# Patient Record
Sex: Female | Born: 1945
Health system: Southern US, Community
[De-identification: ages and names within clinical notes are randomized; demographics above are authoritative.]

## PROBLEM LIST (undated history)

## (undated) DIAGNOSIS — L719 Rosacea, unspecified: Secondary | ICD-10-CM

## (undated) DIAGNOSIS — M1990 Unspecified osteoarthritis, unspecified site: Secondary | ICD-10-CM

## (undated) DIAGNOSIS — I10 Essential (primary) hypertension: Secondary | ICD-10-CM

## (undated) HISTORY — PX: FOOT SURGERY: SHX648

## (undated) HISTORY — PX: HAND SURGERY: SHX662

---

## 2007-06-02 ENCOUNTER — Ambulatory Visit: Payer: Self-pay | Admitting: Unknown Physician Specialty

## 2012-08-07 ENCOUNTER — Ambulatory Visit: Payer: Self-pay | Admitting: Physical Medicine and Rehabilitation

## 2012-10-23 ENCOUNTER — Other Ambulatory Visit: Payer: Self-pay | Admitting: Obstetrics and Gynecology

## 2012-10-23 DIAGNOSIS — R928 Other abnormal and inconclusive findings on diagnostic imaging of breast: Secondary | ICD-10-CM

## 2012-10-29 ENCOUNTER — Ambulatory Visit
Admission: RE | Admit: 2012-10-29 | Discharge: 2012-10-29 | Disposition: A | Discharge status: Home / Self Care | Payer: BC Managed Care – PPO | Source: Ambulatory Visit | Attending: Obstetrics and Gynecology | Admitting: Obstetrics and Gynecology

## 2012-10-29 DIAGNOSIS — R928 Other abnormal and inconclusive findings on diagnostic imaging of breast: Secondary | ICD-10-CM

## 2016-02-08 ENCOUNTER — Emergency Department
Admission: EM | Admit: 2016-02-08 | Discharge: 2016-02-08 | Disposition: A | Discharge status: Home / Self Care | Payer: Medicare Other | Attending: Emergency Medicine | Admitting: Emergency Medicine

## 2016-02-08 DIAGNOSIS — X58XXXA Exposure to other specified factors, initial encounter: Secondary | ICD-10-CM | POA: Diagnosis not present

## 2016-02-08 DIAGNOSIS — L500 Allergic urticaria: Secondary | ICD-10-CM | POA: Insufficient documentation

## 2016-02-08 DIAGNOSIS — Y9389 Activity, other specified: Secondary | ICD-10-CM | POA: Diagnosis not present

## 2016-02-08 DIAGNOSIS — Z79899 Other long term (current) drug therapy: Secondary | ICD-10-CM | POA: Insufficient documentation

## 2016-02-08 DIAGNOSIS — Y998 Other external cause status: Secondary | ICD-10-CM | POA: Diagnosis not present

## 2016-02-08 DIAGNOSIS — R0602 Shortness of breath: Secondary | ICD-10-CM | POA: Insufficient documentation

## 2016-02-08 DIAGNOSIS — T7840XA Allergy, unspecified, initial encounter: Secondary | ICD-10-CM | POA: Diagnosis not present

## 2016-02-08 DIAGNOSIS — Y9289 Other specified places as the place of occurrence of the external cause: Secondary | ICD-10-CM | POA: Diagnosis not present

## 2016-02-08 DIAGNOSIS — F419 Anxiety disorder, unspecified: Secondary | ICD-10-CM | POA: Insufficient documentation

## 2016-02-08 MED ORDER — METHYLPREDNISOLONE SODIUM SUCC 125 MG IJ SOLR
125.0000 mg | Freq: Once | INTRAMUSCULAR | Status: AC
  Administered 2016-02-08: 125 mg via INTRAVENOUS

## 2016-02-08 MED ORDER — DIPHENHYDRAMINE HCL 50 MG/ML IJ SOLN
50.0000 mg | Freq: Once | INTRAMUSCULAR | Status: AC
  Administered 2016-02-08: 50 mg via INTRAVENOUS

## 2016-02-08 MED ORDER — DIPHENHYDRAMINE HCL 25 MG PO CAPS: 50.0000 mg | ORAL_CAPSULE | Freq: Three times a day (TID) | ORAL | Status: DC

## 2016-02-08 MED ORDER — FAMOTIDINE IN NACL 20-0.9 MG/50ML-% IV SOLN
20.0000 mg | Freq: Once | INTRAVENOUS | Status: AC
  Administered 2016-02-08: 20 mg via INTRAVENOUS

## 2016-02-08 MED ORDER — SODIUM CHLORIDE 0.9 % IV BOLUS (SEPSIS)
1000.0000 mL | Freq: Once | INTRAVENOUS | Status: AC
  Administered 2016-02-08: 1000 mL via INTRAVENOUS

## 2016-02-08 MED ORDER — PREDNISONE 20 MG PO TABS: 40.0000 mg | ORAL_TABLET | Freq: Every day | ORAL | Status: AC

## 2016-02-08 NOTE — Discharge Instructions (Signed)
Allergies °An allergy is an abnormal reaction to a substance by the body's defense system (immune system). Allergies can develop at any age. °WHAT CAUSES ALLERGIES? °An allergic reaction happens when the immune system mistakenly reacts to a normally harmless substance, called an allergen, as if it were harmful. The immune system releases antibodies to fight the substance. Antibodies eventually release a chemical called histamine into the bloodstream. The release of histamine is meant to protect the body from infection, but it also causes discomfort. °An allergic reaction can be triggered by: °· Eating an allergen. °· Inhaling an allergen. °· Touching an allergen. °WHAT TYPES OF ALLERGIES ARE THERE? °There are many types of allergies. Common types include: °· Seasonal allergies. People with this type of allergy are usually allergic to substances that are only present during certain seasons, such as molds and pollens. °· Food allergies. °· Drug allergies. °· Insect allergies. °· Animal dander allergies. °WHAT ARE SYMPTOMS OF ALLERGIES? °Possible allergy symptoms include: °· Swelling of the lips, face, tongue, mouth, or throat. °· Sneezing, coughing, or wheezing. °· Nasal congestion. °· Tingling in the mouth. °· Rash. °· Itching. °· Itchy, red, swollen areas of skin (hives). °· Watery eyes. °· Vomiting. °· Diarrhea. °· Dizziness. °· Lightheadedness. °· Fainting. °· Trouble breathing or swallowing. °· Chest tightness. °· Rapid heartbeat. °HOW ARE ALLERGIES DIAGNOSED? °Allergies are diagnosed with a medical and family history and one or more of the following: °· Skin tests. °· Blood tests. °· A food diary. A food diary is a record of all the foods and drinks you have in a day and of all the symptoms you experience. °· The results of an elimination diet. An elimination diet involves eliminating foods from your diet and then adding them back in one by one to find out if a certain food causes an allergic reaction. °HOW ARE  ALLERGIES TREATED? °There is no cure for allergies, but allergic reactions can be treated with medicine. Severe reactions usually need to be treated at a hospital. °HOW CAN REACTIONS BE PREVENTED? °The best way to prevent an allergic reaction is by avoiding the substance you are allergic to. Allergy shots and medicines can also help prevent reactions in some cases. People with severe allergic reactions may be able to prevent a life-threatening reaction called anaphylaxis with a medicine given right after exposure to the allergen. °  °This information is not intended to replace advice given to you by your health care provider. Make sure you discuss any questions you have with your health care provider. °  °Document Released: 02/12/2003 Document Revised: 12/10/2014 Document Reviewed: 08/31/2014 °Elsevier Interactive Patient Education ©2016 [employer] ° ° °Please return immediately if condition worsens. Please contact her primary physician or the physician you were given for referral. If you have any specialist physicians involved in her treatment and plan please also contact them. Thank you for using [facility]. ° °

## 2016-02-08 NOTE — ED Provider Notes (Signed)
Time Seen: Approximately 1500  I have reviewed the triage notes  Chief Complaint: Allergic Reaction   History of Present Illness: Tara Reynolds is a 70 y.o. female who has a history of multiple environmental allergies, etc. Patient's not aware of any new exposures. She arrives to the emergency department respiratory distress with hives. Patient states that she started developing some feelings of not feeling well and some diffuse itching which started yesterday. She took some liquid Benadryl and applied some Benadryl cream to different areas where what she describes as hives seemed to pop off on her chest back extremities etc. Patient states that the itching continued throughout last night and this morning. The patient states that she suddenly became very short of breath and just prior to evaluation here in emergency department received an EpiPen. She has no history of cardiovascular disease.   No past medical history on file.  There are no active problems to display for this patient.   No past surgical history on file.  No past surgical history on file.  Current Outpatient Rx  Name  Route  Sig  Dispense  Refill  . hydrochlorothiazide (HYDRODIURIL) 25 MG tablet   Oral   Take 12.5 mg by mouth daily.         . simvastatin (ZOCOR) 10 MG tablet   Oral   Take 10 mg by mouth at bedtime.         Marland Kitchen. venlafaxine XR (EFFEXOR-XR) 150 MG 24 hr capsule   Oral   Take 150 mg by mouth daily with breakfast.           Allergies:  Shellfish allergy  Family History: No family history on file.  Social History: Social History  Substance Use Topics  . Smoking status: Not on file  . Smokeless tobacco: Not on file  . Alcohol Use: Not on file     Review of Systems:   10 point review of systems was performed and was otherwise negative:  Constitutional: No fever Eyes: No visual disturbances ENT: No sore throat, ear pain Cardiac: No chest pain Respiratory: Shortness of  breath Abdomen: No abdominal pain, no vomiting, No diarrhea Endocrine: No weight loss, No night sweats Extremities: No peripheral edema, cyanosis Skin: Diffuse pruritic erythematous rash Neurologic: No focal weakness, trouble with speech or swollowing Urologic: No dysuria, Hematuria, or urinary frequency   Physical Exam:  ED Triage Vitals  Enc Vitals Group     BP 02/08/16 1450 130/98 mmHg     Pulse Rate 02/08/16 1450 119     Resp 02/08/16 1506 18     Temp --      Temp src --      SpO2 02/08/16 1450 100 %     Weight 02/08/16 1506 175 lb (79.379 kg)     Height 02/08/16 1506 5\' 4"  (1.626 m)     Head Cir --      Peak Flow --      Pain Score --      Pain Loc --      Pain Edu? --      Excl. in GC? --     General: Awake , Alert , and Oriented times 3; anxious sitting upright in a stretcher tachypnea, able to speak Head: Normal cephalic , atraumatic Eyes: Pupils equal , round, reactive to light Nose/Throat: No nasal drainage, patent upper airway no obvious soft tissue swelling in the oral airway Neck: Supple, Full range of motion, no stridor Lungs: Limited air  movement at the apices without wheezing or rhonchi noted Heart: Regular rate, regular rhythm without murmurs , gallops , or rubs Abdomen: Soft, non tender without rebound, guarding , or rigidity; bowel sounds positive and symmetric in all 4 quadrants. No organomegaly .        Extremities: 2 plus symmetric pulses. No edema, clubbing or cyanosis Neurologic: normal ambulation, Motor symmetric without deficits, sensory intact Skin: Diffuse hives like rash across the extremities chest, back, abdomen etc.   EKG:   ED ECG REPORT I, Jennye Moccasin, the attending physician, personally viewed and interpreted this ECG.  Date: 02/08/2016 EKG Time: 1458 Rate: *95 Rhythm: normal sinus rhythm QRS Axis: normal Intervals: normal ST/T Wave abnormalities: normal Conduction Disturbances: none Narrative Interpretation:  unremarkable Low-voltage QRSs Nonspecific ST changes No acute ischemic change   ED Course:  Patient's stay here was uneventful and the patient was started on IV Pepcid, IV Solu-Medrol, and 50 mg of IV Benadryl. Patient was observed for 2 hours and essentially had clearing of all rash. She states her shortness of breath went away shortly after the evaluation at the bedside. Her main hemodynamically stable and states she lives near the hospital. She does have 1 more EpiPen at home and advised him to follow up with her primary physician or allergist for a recheck. We are going to prescribe prednisone to start tomorrow and the patient should receive 50 mg of Benadryl every 8 hours around-the-clock for 72 hours    Assessment: Allergic reaction      Plan:  Outpatient management Patient was advised to return immediately if condition worsens. Patient was advised to follow up with their primary care physician or other specialized physicians involved in their outpatient care            Jennye Moccasin, MD 02/08/16 1734

## 2016-02-10 DIAGNOSIS — M4726 Other spondylosis with radiculopathy, lumbar region: Secondary | ICD-10-CM | POA: Insufficient documentation

## 2016-02-10 DIAGNOSIS — M5416 Radiculopathy, lumbar region: Secondary | ICD-10-CM | POA: Insufficient documentation

## 2016-02-10 DIAGNOSIS — M5136 Other intervertebral disc degeneration, lumbar region: Secondary | ICD-10-CM | POA: Insufficient documentation

## 2016-03-14 DIAGNOSIS — I10 Essential (primary) hypertension: Secondary | ICD-10-CM | POA: Insufficient documentation

## 2016-04-16 ENCOUNTER — Other Ambulatory Visit: Payer: Self-pay | Admitting: Physical Medicine and Rehabilitation

## 2016-04-16 DIAGNOSIS — M5416 Radiculopathy, lumbar region: Secondary | ICD-10-CM

## 2016-05-07 ENCOUNTER — Ambulatory Visit
Admission: RE | Admit: 2016-05-07 | Discharge: 2016-05-07 | Disposition: A | Discharge status: Home / Self Care | Payer: Medicare Other | Source: Ambulatory Visit | Attending: Physical Medicine and Rehabilitation | Admitting: Physical Medicine and Rehabilitation

## 2016-05-07 DIAGNOSIS — M5416 Radiculopathy, lumbar region: Secondary | ICD-10-CM | POA: Diagnosis not present

## 2016-05-07 DIAGNOSIS — M47816 Spondylosis without myelopathy or radiculopathy, lumbar region: Secondary | ICD-10-CM | POA: Insufficient documentation

## 2016-05-07 DIAGNOSIS — M4806 Spinal stenosis, lumbar region: Secondary | ICD-10-CM | POA: Insufficient documentation

## 2016-05-07 DIAGNOSIS — M2578 Osteophyte, vertebrae: Secondary | ICD-10-CM | POA: Diagnosis not present

## 2016-05-07 DIAGNOSIS — M5137 Other intervertebral disc degeneration, lumbosacral region: Secondary | ICD-10-CM | POA: Diagnosis not present

## 2016-08-10 ENCOUNTER — Other Ambulatory Visit
Admission: RE | Admit: 2016-08-10 | Discharge: 2016-08-10 | Disposition: A | Discharge status: Home / Self Care | Payer: Medicare Other | Source: Ambulatory Visit | Attending: Ophthalmology | Admitting: Ophthalmology

## 2016-08-10 DIAGNOSIS — H00033 Abscess of eyelid right eye, unspecified eyelid: Secondary | ICD-10-CM | POA: Diagnosis present

## 2016-08-10 DIAGNOSIS — H0012 Chalazion right lower eyelid: Secondary | ICD-10-CM | POA: Insufficient documentation

## 2016-08-13 LAB — EYE CULTURE

## 2017-11-20 DIAGNOSIS — R7303 Prediabetes: Secondary | ICD-10-CM | POA: Insufficient documentation

## 2017-11-20 DIAGNOSIS — Z66 Do not resuscitate: Secondary | ICD-10-CM | POA: Insufficient documentation

## 2019-12-16 DIAGNOSIS — Z6830 Body mass index (BMI) 30.0-30.9, adult: Secondary | ICD-10-CM | POA: Diagnosis not present

## 2019-12-16 DIAGNOSIS — Z01419 Encounter for gynecological examination (general) (routine) without abnormal findings: Secondary | ICD-10-CM | POA: Diagnosis not present

## 2019-12-16 DIAGNOSIS — Z1231 Encounter for screening mammogram for malignant neoplasm of breast: Secondary | ICD-10-CM | POA: Diagnosis not present

## 2019-12-30 DIAGNOSIS — H2513 Age-related nuclear cataract, bilateral: Secondary | ICD-10-CM | POA: Diagnosis not present

## 2020-05-03 DIAGNOSIS — L710 Perioral dermatitis: Secondary | ICD-10-CM | POA: Diagnosis not present

## 2020-05-12 DIAGNOSIS — R7303 Prediabetes: Secondary | ICD-10-CM | POA: Diagnosis not present

## 2020-05-12 DIAGNOSIS — E7800 Pure hypercholesterolemia, unspecified: Secondary | ICD-10-CM | POA: Diagnosis not present

## 2020-05-19 DIAGNOSIS — F3340 Major depressive disorder, recurrent, in remission, unspecified: Secondary | ICD-10-CM | POA: Diagnosis not present

## 2020-05-19 DIAGNOSIS — I10 Essential (primary) hypertension: Secondary | ICD-10-CM | POA: Diagnosis not present

## 2020-05-19 DIAGNOSIS — Z79899 Other long term (current) drug therapy: Secondary | ICD-10-CM | POA: Diagnosis not present

## 2020-05-19 DIAGNOSIS — Z0000 Encounter for general adult medical examination without abnormal findings: Secondary | ICD-10-CM | POA: Diagnosis not present

## 2020-05-19 DIAGNOSIS — E7800 Pure hypercholesterolemia, unspecified: Secondary | ICD-10-CM | POA: Diagnosis not present

## 2020-05-19 DIAGNOSIS — R7303 Prediabetes: Secondary | ICD-10-CM | POA: Diagnosis not present

## 2020-06-21 DIAGNOSIS — K219 Gastro-esophageal reflux disease without esophagitis: Secondary | ICD-10-CM | POA: Diagnosis not present

## 2020-07-04 DIAGNOSIS — Z1211 Encounter for screening for malignant neoplasm of colon: Secondary | ICD-10-CM | POA: Diagnosis not present

## 2020-07-25 ENCOUNTER — Other Ambulatory Visit: Payer: Self-pay | Admitting: Physician Assistant

## 2020-07-25 DIAGNOSIS — J301 Allergic rhinitis due to pollen: Secondary | ICD-10-CM | POA: Diagnosis not present

## 2020-07-25 DIAGNOSIS — R1310 Dysphagia, unspecified: Secondary | ICD-10-CM

## 2020-07-25 DIAGNOSIS — K219 Gastro-esophageal reflux disease without esophagitis: Secondary | ICD-10-CM | POA: Diagnosis not present

## 2020-07-25 DIAGNOSIS — R490 Dysphonia: Secondary | ICD-10-CM | POA: Diagnosis not present

## 2020-07-26 DIAGNOSIS — M1712 Unilateral primary osteoarthritis, left knee: Secondary | ICD-10-CM | POA: Diagnosis not present

## 2020-07-29 ENCOUNTER — Other Ambulatory Visit: Payer: Self-pay

## 2020-07-29 ENCOUNTER — Ambulatory Visit
Admission: RE | Admit: 2020-07-29 | Discharge: 2020-07-29 | Disposition: A | Discharge status: Home / Self Care | Payer: Medicare HMO | Source: Ambulatory Visit | Attending: Physician Assistant | Admitting: Physician Assistant

## 2020-07-29 DIAGNOSIS — K449 Diaphragmatic hernia without obstruction or gangrene: Secondary | ICD-10-CM | POA: Diagnosis not present

## 2020-07-29 DIAGNOSIS — R1310 Dysphagia, unspecified: Secondary | ICD-10-CM | POA: Diagnosis not present

## 2020-08-24 DIAGNOSIS — K219 Gastro-esophageal reflux disease without esophagitis: Secondary | ICD-10-CM | POA: Diagnosis not present

## 2020-08-24 DIAGNOSIS — R1310 Dysphagia, unspecified: Secondary | ICD-10-CM | POA: Diagnosis not present

## 2020-08-24 DIAGNOSIS — R490 Dysphonia: Secondary | ICD-10-CM | POA: Diagnosis not present

## 2020-09-19 DIAGNOSIS — L281 Prurigo nodularis: Secondary | ICD-10-CM | POA: Diagnosis not present

## 2020-09-19 DIAGNOSIS — L578 Other skin changes due to chronic exposure to nonionizing radiation: Secondary | ICD-10-CM | POA: Diagnosis not present

## 2020-09-19 DIAGNOSIS — L218 Other seborrheic dermatitis: Secondary | ICD-10-CM | POA: Diagnosis not present

## 2020-09-19 DIAGNOSIS — L821 Other seborrheic keratosis: Secondary | ICD-10-CM | POA: Diagnosis not present

## 2020-09-19 DIAGNOSIS — L718 Other rosacea: Secondary | ICD-10-CM | POA: Diagnosis not present

## 2020-10-13 ENCOUNTER — Ambulatory Visit: Payer: Medicare Other | Admitting: Gastroenterology

## 2020-11-01 ENCOUNTER — Other Ambulatory Visit: Payer: Self-pay

## 2020-11-01 DIAGNOSIS — E7800 Pure hypercholesterolemia, unspecified: Secondary | ICD-10-CM | POA: Insufficient documentation

## 2020-11-01 DIAGNOSIS — F3340 Major depressive disorder, recurrent, in remission, unspecified: Secondary | ICD-10-CM | POA: Insufficient documentation

## 2020-11-01 DIAGNOSIS — M8580 Other specified disorders of bone density and structure, unspecified site: Secondary | ICD-10-CM | POA: Insufficient documentation

## 2020-11-02 ENCOUNTER — Ambulatory Visit (INDEPENDENT_AMBULATORY_CARE_PROVIDER_SITE_OTHER): Payer: Medicare HMO | Admitting: Gastroenterology

## 2020-11-02 ENCOUNTER — Encounter: Payer: Self-pay | Admitting: Gastroenterology

## 2020-11-02 ENCOUNTER — Other Ambulatory Visit: Payer: Self-pay

## 2020-11-02 VITALS — BP 117/77 | HR 76 | Temp 98.1°F | Ht 64.0 in | Wt 162.8 lb

## 2020-11-02 DIAGNOSIS — K219 Gastro-esophageal reflux disease without esophagitis: Secondary | ICD-10-CM

## 2020-11-02 DIAGNOSIS — R1310 Dysphagia, unspecified: Secondary | ICD-10-CM

## 2020-11-03 NOTE — Progress Notes (Signed)
[facility] [address]  [address]  [place], [place] [address]  Main: [phone]  Fax: [phone]   Gastroenterology Consultation  Referring Provider:     Wendy Reynolds* Primary Care Physician:  Tara Ivan, MD Reason for Consultation:             HPI:    Chief Complaint  Patient presents with  . Hiatal Hernia    Patient has some tightness on her upper chest. Currently on Dexilant daily.    Tara Reynolds is a 74 y.o. y/o female referred for consultation & management  by Dr. Marisue Ivan, MD.  Patient was recently seen by ENT due to voice changes and was noted to have findings of laryngeal pharyngeal reflux on laryngoscopy.  She has been started on omeprazole.  She continues to report globus sensation.  She then underwent esophagram that showed mild B ring, small hiatal hernia and patient was referred to [place].  Patient denies any previous upper endoscopy.  Does have intermittent odynophagia as well.  No weight loss.  No nausea or vomiting  She was [facility] and underwent CRC screening with Cologuard testing negative in August 2021.  I personally reviewed their office note and in July 2018 note they state that patient had a colonoscopy in 2008 that showed diverticulosis, internal hemorrhoids.  History reviewed. No pertinent past medical history.  History reviewed. No pertinent surgical history.  Prior to Admission medications   Medication Sig Start Date End Date Taking? Authorizing Provider  Calcium Carbonate-Vitamin D 600-400 MG-UNIT tablet Take 2 tablets by mouth daily.   Yes [provider]  estradiol (ESTRACE) 0.1 MG/GM vaginal cream Place 1 g vaginally as needed. 09/29/20  Yes [provider]  fluocinonide (LIDEX) 0.05 % external solution  09/19/20  Yes [provider]  hydrochlorothiazide (HYDRODIURIL) 25 MG tablet Take 12.5 mg by mouth daily.   Yes [provider]    loratadine (CLARITIN) 10 MG tablet Take 1 tablet by mouth as needed.   Yes [provider]  metroNIDAZOLE (METROCREAM) 0.75 % cream Apply 1 application topically daily. Apply around nose, mouth and eyelids. 05/03/20 05/03/21 Yes [provider]  naproxen sodium (ALEVE) 220 MG tablet Take 1 tablet by mouth daily.   Yes [provider]  omeprazole (PRILOSEC) 20 MG capsule Take 1 capsule by mouth daily. 06/29/20  Yes [provider]  simvastatin (ZOCOR) 20 MG tablet Take 1 tablet by mouth daily. 09/18/20  Yes [provider]  venlafaxine XR (EFFEXOR-XR) 150 MG 24 hr capsule Take 150 mg by mouth daily with breakfast.   Yes [provider]    History reviewed. No pertinent family history.   Social History   Tobacco Use  . Smoking status: Never Smoker  . Smokeless tobacco: Never Used  Substance Use Topics  . Alcohol use: Not Currently  . Drug use: Not on file    Allergies as of 11/02/2020 - Review Complete 11/02/2020  Allergen Reaction Noted  . Citrus bioflavonoid Anaphylaxis, Hives, Rash, and Shortness Of Breath 11/01/2020  . Melatonin Anaphylaxis and Rash 03/14/2016  . Other Anaphylaxis 12/06/2014  . Shellfish allergy Anaphylaxis 02/08/2016    Review of Systems:    All systems reviewed and negative except where noted in HPI.   Physical Exam:  BP 117/77   Pulse 76   Temp 98.1 F (36.7 C) (Oral)   Ht 5\' 4"  (1.626 m)   Wt 162 lb 12.8  oz (73.8 kg)   BMI 27.94 kg/m  No LMP recorded. Patient is postmenopausal. Psych:  Alert and cooperative. Normal mood and affect. General:   Alert,  Well-developed, well-nourished, pleasant and cooperative in NAD Head:  Normocephalic and atraumatic. Eyes:  Sclera clear, no icterus.   Conjunctiva pink. Ears:  Normal auditory acuity. Nose:  No deformity, discharge, or lesions. Mouth:  No deformity or lesions,oropharynx pink & moist. Neck:  Supple; no masses or thyromegaly. Abdomen:  Normal bowel  sounds.  No bruits.  Soft, non-tender and non-distended without masses, hepatosplenomegaly or hernias noted.  No guarding or rebound tenderness.    Msk:  Symmetrical without gross deformities. Good, equal movement & strength bilaterally. Pulses:  Normal pulses noted. Extremities:  No clubbing or edema.  No cyanosis. Neurologic:  Alert and oriented x3;  grossly normal neurologically. Skin:  Intact without significant lesions or rashes. No jaundice. Lymph Nodes:  No significant cervical adenopathy. Psych:  Alert and cooperative. Normal mood and affect.   Labs: CBC No results found for: WBC, RBC, HGB, HCT, PLT, MCV, MCH, MCHC, RDW, LYMPHSABS, MONOABS, EOSABS, BASOSABS CMP  No results found for: NA, K, CL, CO2, GLUCOSE, BUN, CREATININE, CALCIUM, PROT, ALBUMIN, AST, ALT, ALKPHOS, BILITOT, GFRNONAA, GFRAA  Imaging Studies: No results found.  Assessment and Plan:   Tara Reynolds is a 74 y.o. y/o female has been referred for GERD  Patient reports continued breakthrough symptoms despite PPI and is also reporting some odynophagia  EGD indicated for evaluation of the above  Patient educated extensively on acid reflux lifestyle modification, including buying a bed wedge, not eating 3 hrs before bedtime, diet modifications, and handout given for the same.   (Risks of PPI use were discussed with patient including bone loss, C. Diff diarrhea, pneumonia, infections, CKD, electrolyte abnormalities.  Pt. Verbalizes understanding and chooses to continue the medication.)  I have discussed alternative options, risks & benefits,  which include, but are not limited to, bleeding, infection, perforation,respiratory complication & drug reaction.  The patient agrees with this plan & written consent will be obtained.       Dr Tara Reynolds  Speech recognition software was used to dictate the above note.

## 2020-11-11 DIAGNOSIS — I10 Essential (primary) hypertension: Secondary | ICD-10-CM | POA: Diagnosis not present

## 2020-11-11 DIAGNOSIS — E7800 Pure hypercholesterolemia, unspecified: Secondary | ICD-10-CM | POA: Diagnosis not present

## 2020-11-11 DIAGNOSIS — R7303 Prediabetes: Secondary | ICD-10-CM | POA: Diagnosis not present

## 2020-11-18 DIAGNOSIS — Z0000 Encounter for general adult medical examination without abnormal findings: Secondary | ICD-10-CM | POA: Diagnosis not present

## 2020-11-18 DIAGNOSIS — E785 Hyperlipidemia, unspecified: Secondary | ICD-10-CM | POA: Diagnosis not present

## 2020-11-18 DIAGNOSIS — I10 Essential (primary) hypertension: Secondary | ICD-10-CM | POA: Diagnosis not present

## 2020-12-07 ENCOUNTER — Telehealth: Payer: Self-pay

## 2020-12-07 NOTE — Telephone Encounter (Signed)
Patient's call has been returned.  She LVM states that she would like to cancel her EGD scheduled for 12/14/20.  She states she doesn't think she needs to have it done.  Thanks,  [place], [place]

## 2020-12-12 ENCOUNTER — Other Ambulatory Visit: Admission: RE | Admit: 2020-12-12 | Payer: Medicare Other | Source: Ambulatory Visit

## 2020-12-14 ENCOUNTER — Ambulatory Visit: Admit: 2020-12-14 | Payer: Medicare HMO | Admitting: Gastroenterology

## 2020-12-14 SURGERY — ESOPHAGOGASTRODUODENOSCOPY (EGD) WITH PROPOFOL
Anesthesia: General

## 2021-01-18 ENCOUNTER — Other Ambulatory Visit: Payer: Self-pay

## 2021-01-18 ENCOUNTER — Emergency Department: Payer: Medicare Other

## 2021-01-18 ENCOUNTER — Observation Stay
Admission: EM | Admit: 2021-01-18 | Discharge: 2021-01-18 | Disposition: A | Discharge status: Home / Self Care | Payer: Medicare Other | Attending: Internal Medicine | Admitting: Internal Medicine

## 2021-01-18 DIAGNOSIS — N19 Unspecified kidney failure: Secondary | ICD-10-CM

## 2021-01-18 DIAGNOSIS — Z79899 Other long term (current) drug therapy: Secondary | ICD-10-CM | POA: Insufficient documentation

## 2021-01-18 DIAGNOSIS — E860 Dehydration: Secondary | ICD-10-CM | POA: Diagnosis not present

## 2021-01-18 DIAGNOSIS — I10 Essential (primary) hypertension: Secondary | ICD-10-CM | POA: Diagnosis not present

## 2021-01-18 DIAGNOSIS — Z20822 Contact with and (suspected) exposure to covid-19: Secondary | ICD-10-CM | POA: Diagnosis not present

## 2021-01-18 DIAGNOSIS — E7800 Pure hypercholesterolemia, unspecified: Secondary | ICD-10-CM

## 2021-01-18 DIAGNOSIS — Z7982 Long term (current) use of aspirin: Secondary | ICD-10-CM | POA: Insufficient documentation

## 2021-01-18 DIAGNOSIS — R55 Syncope and collapse: Principal | ICD-10-CM | POA: Insufficient documentation

## 2021-01-18 DIAGNOSIS — E876 Hypokalemia: Secondary | ICD-10-CM

## 2021-01-18 HISTORY — DX: Essential (primary) hypertension: I10

## 2021-01-18 LAB — URINALYSIS, COMPLETE (UACMP) WITH MICROSCOPIC
Bilirubin Urine: NEGATIVE
Glucose, UA: NEGATIVE mg/dL
Hgb urine dipstick: NEGATIVE
Ketones, ur: NEGATIVE mg/dL
Leukocytes,Ua: NEGATIVE
Nitrite: NEGATIVE
Protein, ur: NEGATIVE mg/dL
Specific Gravity, Urine: 1.013 (ref 1.005–1.030)
pH: 6 (ref 5.0–8.0)

## 2021-01-18 LAB — COMPREHENSIVE METABOLIC PANEL
ALT: 14 U/L (ref 0–44)
AST: 19 U/L (ref 15–41)
Albumin: 4 g/dL (ref 3.5–5.0)
Alkaline Phosphatase: 51 U/L (ref 38–126)
Anion gap: 10 (ref 5–15)
BUN: 20 mg/dL (ref 8–23)
CO2: 27 mmol/L (ref 22–32)
Calcium: 10 mg/dL (ref 8.9–10.3)
Chloride: 99 mmol/L (ref 98–111)
Creatinine, Ser: 0.94 mg/dL (ref 0.44–1.00)
GFR, Estimated: >60 mL/min (ref >60)
Glucose, Bld: 111 mg/dL — ABNORMAL HIGH (ref 70–99)
Potassium: 3.4 mmol/L — ABNORMAL LOW (ref 3.5–5.1)
Sodium: 136 mmol/L (ref 135–145)
Total Bilirubin: 0.5 mg/dL (ref 0.3–1.2)
Total Protein: 6.7 g/dL (ref 6.5–8.1)

## 2021-01-18 LAB — CBC WITH DIFFERENTIAL/PLATELET
Abs Immature Granulocytes: 0.03 10*3/uL (ref 0.00–0.07)
Basophils Absolute: 0.1 10*3/uL (ref 0.0–0.1)
Basophils Relative: 1 %
Eosinophils Absolute: 0.2 10*3/uL (ref 0.0–0.5)
Eosinophils Relative: 2 %
HCT: 39.9 % (ref 36.0–46.0)
Hemoglobin: 13.7 g/dL (ref 12.0–15.0)
Immature Granulocytes: 0 %
Lymphocytes Relative: 27 %
Lymphs Abs: 2.4 10*3/uL (ref 0.7–4.0)
MCH: 31.8 pg (ref 26.0–34.0)
MCHC: 34.3 g/dL (ref 30.0–36.0)
MCV: 92.6 fL (ref 80.0–100.0)
Monocytes Absolute: 0.9 10*3/uL (ref 0.1–1.0)
Monocytes Relative: 9 %
Neutro Abs: 5.6 10*3/uL (ref 1.7–7.7)
Neutrophils Relative %: 61 %
Platelets: 270 10*3/uL (ref 150–400)
RBC: 4.31 MIL/uL (ref 3.87–5.11)
RDW: 11.8 % (ref 11.5–15.5)
WBC: 9.2 10*3/uL (ref 4.0–10.5)
nRBC: 0 % (ref 0.0–0.2)

## 2021-01-18 LAB — RESP PANEL BY RT-PCR (FLU A&B, COVID) ARPGX2
Influenza A by PCR: NEGATIVE
Influenza B by PCR: NEGATIVE
SARS Coronavirus 2 by RT PCR: NEGATIVE

## 2021-01-18 LAB — TROPONIN I (HIGH SENSITIVITY)
Troponin I (High Sensitivity): 3 ng/L (ref <18)
Troponin I (High Sensitivity): 4 ng/L (ref <18)

## 2021-01-18 LAB — MAGNESIUM: Magnesium: 1.9 mg/dL (ref 1.7–2.4)

## 2021-01-18 LAB — BRAIN NATRIURETIC PEPTIDE: B Natriuretic Peptide: 15.8 pg/mL (ref 0.0–100.0)

## 2021-01-18 MED ORDER — POTASSIUM CHLORIDE CRYS ER 20 MEQ PO TBCR: 40.0000 meq | EXTENDED_RELEASE_TABLET | Freq: Once | ORAL | Status: DC

## 2021-01-18 MED ORDER — VENLAFAXINE HCL ER 150 MG PO CP24
150.0000 mg | ORAL_CAPSULE | Freq: Every day | ORAL | Status: DC
  Filled 2021-01-18: qty 1

## 2021-01-18 MED ORDER — LACTATED RINGERS IV SOLN: INTRAVENOUS | Status: DC

## 2021-01-18 MED ORDER — SIMVASTATIN 10 MG PO TABS: 20.0000 mg | ORAL_TABLET | Freq: Every day | ORAL | Status: DC

## 2021-01-18 MED ORDER — PANTOPRAZOLE SODIUM 40 MG PO TBEC: 40.0000 mg | DELAYED_RELEASE_TABLET | Freq: Every day | ORAL | Status: DC

## 2021-01-18 NOTE — ED Notes (Signed)
Pt states she was at the hair salon and then had LOC. Pt states she does not recall what all happened. Pt resting in bed, husband at bedside. Cardiac, bp and pulse ox monitor on.

## 2021-01-18 NOTE — ED Triage Notes (Addendum)
Pt here via [facility] from hair salon.  Ems reports patient fell asleep in chair, when she woke up her speech was "thick" but not slurred, and her arms were slumped bilaterally. Pt alert and oriented, negative stroke screen with EMS. Pt reports she didn't eat any thing prior to appointment, cbg 118 with EMS.  Pt had L foot surgery feb 4th, denies use of pain medication for two days. Reports using ibuprofen yesterday.    LKW 10am.  EMS VSS- bp 138/81, HR NSR 77.

## 2021-01-18 NOTE — ED Provider Notes (Signed)
[facility] Emergency Department Provider Note   ____________________________________________   Event Date/Time   First MD Initiated Contact with Patient 01/18/21 1157     (approximate)  I have reviewed the triage vital signs and the nursing notes.   HISTORY  Chief Complaint Dizziness    HPI Tara Reynolds is a 75 y.o. female with a stated past medical history of hypertension who presents for an episode of syncope that occurred at approximately 10 AM today.  Patient states that she was at her hairdresser's when the next thing she knew she woke up with EMS around her.  Patient states that she did not have any preceding symptoms including denying any feelings that she was flushed, diaphoresis, chest pain, shortness of breath, headache, blurred vision, or any other preceding symptoms.  Patient denies any pain when she woke up and states that she immediately knew where she was and returned to baseline.  Patient did say that she had an episode of "speaking like I just woke up" and felt as though her arms were heavy.  Patient currently denies any complaints.  Patient denies any previous symptoms similar to this in the past.  Patient currently denies any vision changes, tinnitus, difficulty speaking, facial droop, sore throat, chest pain, shortness of breath, abdominal pain, nausea/vomiting/diarrhea, dysuria, or weakness/numbness/paresthesias in any extremity         Past Medical History:  Diagnosis Date  . Hypertension     Patient Active Problem List   Diagnosis Date Noted  . Hypokalemia 01/19/2021  . Dehydration 01/19/2021  . Acute prerenal azotemia 01/19/2021  . Syncope 01/18/2021  . Osteopenia 11/01/2020  . Pure hypercholesterolemia 11/01/2020  . Recurrent major depressive disorder, in remission (HCC) 11/01/2020  . Borderline diabetes mellitus 11/20/2017  . DNR (do not resuscitate) 11/20/2017  . Essential hypertension 03/14/2016  . DDD (degenerative  disc disease), lumbar 02/10/2016  . Lumbar radiculitis 02/10/2016  . Osteoarthritis of spine with radiculopathy, lumbar region 02/10/2016    Past Surgical History:  Procedure Laterality Date  . FOOT SURGERY    . HAND SURGERY      Prior to Admission medications   Medication Sig Start Date End Date Taking? Authorizing Provider  ibuprofen (ADVIL) 800 MG tablet Take 800 mg by mouth every 8 (eight) hours as needed for pain. 01/04/21 04/04/21 Yes [provider]  ASPIRIN LOW DOSE 81 MG EC tablet Take 81 mg by mouth daily. 01/05/21   [provider]  Azelaic Acid 15 % cream Apply 1 application topically daily. 12/22/20   [provider]  Calcium Carbonate-Vitamin D 600-400 MG-UNIT tablet Take 2 tablets by mouth daily.    [provider]  estradiol (ESTRACE) 0.1 MG/GM vaginal cream Place 1 g vaginally as needed. 09/29/20   [provider]  fluocinonide (LIDEX) 0.05 % external solution  09/19/20   [provider]  hydrochlorothiazide (HYDRODIURIL) 25 MG tablet Take 12.5 mg by mouth daily.    [provider]  HYDROcodone-acetaminophen (NORCO/VICODIN) 5-325 MG tablet Take 1 tablet by mouth every 4 (four) hours as needed for pain. 01/05/21   [provider]  loratadine (CLARITIN) 10 MG tablet Take 1 tablet by mouth as needed.    [provider]  simvastatin (ZOCOR) 20 MG tablet Take 1 tablet by mouth daily. 09/18/20   [provider]  venlafaxine XR (EFFEXOR-XR) 150 MG 24 hr capsule Take 150 mg by mouth daily with breakfast.    [provider]  Allergies Citrus bioflavonoid, Melatonin, Other, and Shellfish allergy  History reviewed. No pertinent family history.  Social History Social History   Tobacco Use  . Smoking status: Never Smoker  . Smokeless tobacco: Never Used  Substance Use Topics  . Alcohol use: Not Currently    Review of Systems Constitutional: No fever/chills Eyes: No visual  changes. ENT: No sore throat. Cardiovascular: Denies chest pain. Respiratory: Denies shortness of breath. Gastrointestinal: No abdominal pain.  No nausea, no vomiting.  No diarrhea. Genitourinary: Negative for dysuria. Musculoskeletal: Negative for acute arthralgias Skin: Negative for rash. Neurological: Endorses loss of consciousness.  Negative for headaches, weakness/numbness/paresthesias in any extremity Psychiatric: Negative for suicidal ideation/homicidal ideation   ____________________________________________   PHYSICAL EXAM:  VITAL SIGNS: ED Triage Vitals  Enc Vitals Group     BP 01/18/21 1131 (!) 146/89     Pulse Rate 01/18/21 1131 87     Resp 01/18/21 1131 12     Temp 01/18/21 1131 97.7 F (36.5 C)     Temp Source 01/18/21 1131 Oral     SpO2 01/18/21 1131 98 %     Weight 01/18/21 1132 155 lb (70.3 kg)     Height 01/18/21 1132 5\' 5"  (1.651 m)     Head Circumference --      Peak Flow --      Pain Score 01/18/21 1132 0     Pain Loc --      Pain Edu? --      Excl. in GC? --    Constitutional: Alert and oriented. Well appearing and in no acute distress. Eyes: Conjunctivae are normal. PERRL. Head: Atraumatic. Nose: No congestion/rhinnorhea. Mouth/Throat: Mucous membranes are moist. Neck: No stridor Cardiovascular: Grossly normal heart sounds.  Good peripheral circulation. Respiratory: Normal respiratory effort.  No retractions. Gastrointestinal: Soft and nontender. No distention. Musculoskeletal: No obvious deformities Neurologic:  Normal speech and language. No gross focal neurologic deficits are appreciated. Skin:  Skin is warm and dry. No rash noted. Psychiatric: Mood and affect are normal. Speech and behavior are normal.  ____________________________________________   LABS (all labs ordered are listed, but only abnormal results are displayed)  Labs Reviewed  COMPREHENSIVE METABOLIC PANEL - Abnormal; Notable for the following components:      Result  Value   Potassium 3.4 (*)    Glucose, Bld 111 (*)    All other components within normal limits  URINALYSIS, COMPLETE (UACMP) WITH MICROSCOPIC - Abnormal; Notable for the following components:   Color, Urine YELLOW (*)    APPearance CLEAR (*)    Bacteria, UA RARE (*)    All other components within normal limits  RESP PANEL BY RT-PCR (FLU A&B, COVID) ARPGX2  BRAIN NATRIURETIC PEPTIDE  CBC WITH DIFFERENTIAL/PLATELET  MAGNESIUM  TROPONIN I (HIGH SENSITIVITY)  TROPONIN I (HIGH SENSITIVITY)   ____________________________________________  EKG  ED ECG REPORT I, 01/20/21, the attending physician, personally viewed and interpreted this ECG.  Date: 01/18/2021 EKG Time: 1133 Rate: 71 Rhythm: normal sinus rhythm QRS Axis: normal Intervals: normal ST/T Wave abnormalities: normal Narrative Interpretation: no evidence of acute ischemia  ____________________________________________  RADIOLOGY  ED MD interpretation: CT of the head without contrast shows no evidence of acute abnormalities including no ICH, significant edema, or obvious masses  Official radiology report(s): No results found.  ____________________________________________   PROCEDURES  Procedure(s) performed (including Critical Care):  .1-3 Lead EKG Interpretation Performed by: 01/20/2021, MD Authorized by: Merwyn Katos, MD     Interpretation: normal  ECG rate:  72   ECG rate assessment: normal     Rhythm: sinus rhythm     Ectopy: none     Conduction: normal       ____________________________________________   INITIAL IMPRESSION / ASSESSMENT AND PLAN / ED COURSE  As part of my medical decision making, I reviewed the following data within the electronic MEDICAL RECORD NUMBER Nursing notes reviewed and incorporated, Labs reviewed, EKG interpreted, Old chart reviewed, Radiograph reviewed and Notes from prior ED visits reviewed and incorporated        Patient presents with complaints of  syncope/presyncope ED Workup:  CBC, BMP, Troponin, BNP, ECG, CXR Differential diagnosis includes HF, ICH, seizure, stroke, HOCM, ACS, aortic dissection, malignant arrhythmia, or GI bleed. Findings: No evidence of acute laboratory abnormalities.  Troponin negative x1 EKG: No e/o STEMI. No evidence of Brugadas sign, delta wave, epsilon wave, significantly prolonged QTc, or malignant arrhythmia.  Disposition: Admit to medicine, telemetry bed for cardiac monitoring and cardiology review.      ____________________________________________   FINAL CLINICAL IMPRESSION(S) / ED DIAGNOSES  Final diagnoses:  Syncope, unspecified syncope type     ED Discharge Orders    None       Note:  This document was prepared using Dragon voice recognition software and may include unintentional dictation errors.   Merwyn Katos, MD 01/19/21 [phone]

## 2021-01-18 NOTE — ED Notes (Signed)
Pt signing out AMA due to having foot appointment tomorrow. Hospitalist aware.

## 2021-01-18 NOTE — H&P (Signed)
History and Physical    PLEASE NOTE THAT DRAGON DICTATION SOFTWARE WAS USED IN THE CONSTRUCTION OF THIS NOTE.   Tara Reynolds ZOX:[id number] DOB: 05/22/1946 DOA: 01/18/2021  PCP: Marisue IvanLinthavong, [place], MD Patient coming from: home   I have personally briefly reviewed patient's old medical records in [facility]  Chief Complaint: Syncope  HPI: Tara Reynolds is a 75 y.o. female with medical history significant for hypertension, prediabetes, hyperlipidemia, depression, who is admitted to [facility] on 01/18/2021 for further evaluation and management of syncope after presenting from home to [facility] ED complaining of episode of loss of consciousness.  The patient reports that she was in her normal state of health when, she experienced an episode of loss of consciousness around 10 AM today while getting her haircut at the salon.  At the time of this event, the patient reports that she was in a seated position and that she experienced no preceding diaphoresis, dizziness, or symptoms of presyncope.  Rather, she reports that she suddenly lost consciousness, without prodrome, before waking up with EMS personnel standing around her.  The hairdresser conveyed to EMS that the patient appeared to have lost consciousness for between 3 to 4 minutes before awakening without any confusion.  Event was not associate with any tongue biting or loss of bowel/bladder control.  Additionally, there was no witnessed tonic-clonic activity.  The patient denies any associated acute focal weakness, acute focal numbness paresthesias, slurred speech, facial droop, change in vision, vertigo.  She has never previously experienced an episode of syncope.    Denies any associated or recent chest pain, palpitations, diaphoresis, shortness of breath, nausea, vomiting.  Denies any recent orthopnea, PND, or worsening of peripheral edema.  Denies any recent melena or hematochezia.   Medical history notable for  essential hypertension, for which she is on HCTZ at home.  Additionally, she reports she underwent bunionectomy of the right foot [facility] weeks ago [facility], that she she has a follow-up appointment scheduled with the operating podiatrist tomorrow (01/19/21) at 1400.  Denies any recent calf tenderness or hemoptysis.  No personal history of DVT/PE.  Denies any recent trauma or travel.  Other than recent bunionectomy, denies any recent surgical procedures or periods of diminished ambulatory activity.   Per chart review, no prior echocardiogram on file.  Denies any history of underlying coronary artery disease.  Denies any recent subjective fever, chills, rigors, or generalized myalgias. Denies any recent headache, neck stiffness, rhinitis, rhinorrhea, sore throat, sob, wheezing, cough, abdominal pain, diarrhea, or rash. No recent traveling or known COVID-19 exposures. Denies dysuria, gross hematuria, or change in urinary urgency/frequency.      ED Course:  Vital signs in the ED were notable for the following: Temperature max 97.7; heart rate 71-87; blood pressure 135/81 - 146/89; respiratory rate 15-18; oxygen saturation 97 to 100% on room air.  Labs were notable for the following: CMP was notable for the following: Sodium 136, potassium 3.4, bicarbonate 27, BUN 20, creatinine 0.94, glucose 111.  High-sensitivity troponin initially found to be 3, with repeat value trending up slightly to 4.  CBC notable for white blood cell count of 9200, hemoglobin 13.7.  Urinalysis showed no white blood cells, rare bacteria, nitrate negative, leukocyte esterase negative, and was positive for hyaline cast.  Screening nasopharyngeal COVID-19/influenza PCR was performed in the ED today, and found to be negative.  Noncontrast CT of the head showed no evidence of acute intracranial process, including no evidence of intracranial  hemorrhage or acute infarct.  I discussed the patient's presenting EKG with the emergency department  physician, as the EKG has not yet been released peripheral review.  Per ED physician, presenting EKG shows normal sinus rhythm without evidence of acute ischemic changes.  No medications or IV fluids administered in the ED, pending result of orthostatic vital signs.    Review of Systems: As per HPI otherwise 10 point review of systems negative.   Past Medical History:  Diagnosis Date  . Hypertension     Past Surgical History:  Procedure Laterality Date  . FOOT SURGERY    . HAND SURGERY      Social History:  reports that she has never smoked. She has never used smokeless tobacco. She reports previous alcohol use. No history on file for drug use.   Allergies  Allergen Reactions  . Citrus Bioflavonoid Anaphylaxis, Hives, Rash and Shortness Of Breath  . Melatonin Anaphylaxis and Rash  . Other Anaphylaxis    Other reaction(s): Other (See Comments) All fruits except bananas- causes anaphylaxis Grass & trees- causes hay-fever like sx's Dog's and Cat's   . Shellfish Allergy Anaphylaxis    History reviewed. No pertinent family history.    Prior to Admission medications   Medication Sig Start Date End Date Taking? Authorizing Provider  Calcium Carbonate-Vitamin D 600-400 MG-UNIT tablet Take 2 tablets by mouth daily.    [provider]  estradiol (ESTRACE) 0.1 MG/GM vaginal cream Place 1 g vaginally as needed. 09/29/20   [provider]  fluocinonide (LIDEX) 0.05 % external solution  09/19/20   [provider]  hydrochlorothiazide (HYDRODIURIL) 25 MG tablet Take 12.5 mg by mouth daily.    [provider]  loratadine (CLARITIN) 10 MG tablet Take 1 tablet by mouth as needed.    [provider]  metroNIDAZOLE (METROCREAM) 0.75 % cream Apply 1 application topically daily. Apply around nose, mouth and eyelids. 05/03/20 05/03/21  [provider]  naproxen sodium (ALEVE) 220 MG tablet Take 1 tablet by mouth daily.    [provider]  omeprazole (PRILOSEC) 20 MG capsule Take 1 capsule by mouth daily. 06/29/20   [provider]  simvastatin (ZOCOR) 20 MG tablet Take 1 tablet by mouth daily. 09/18/20   [provider]  venlafaxine XR (EFFEXOR-XR) 150 MG 24 hr capsule Take 150 mg by mouth daily with breakfast.    [provider]     Objective    Physical Exam: Vitals:   01/18/21 1330 01/18/21 1400 01/18/21 1430 01/18/21 1530  BP: 136/74 136/81 135/81 135/85  Pulse: 72 78 76 74  Resp: 15 18 15 14   Temp:      TempSrc:      SpO2: 97% 98% 99% 97%  Weight:      Height:        General: appears to be stated age; alert, oriented Skin: warm, dry, no rash Head:  AT/[place] Mouth:  Oral mucosa membranes appear dry, normal dentition Neck: supple; trachea midline Heart:  RRR; did not appreciate any M/R/G Lungs: CTAB, did not appreciate any wheezes, rales, or rhonchi Abdomen: + BS; soft, ND, NT Vascular: 2+ pedal pulses b/l; 2+ radial pulses b/l Extremities: no peripheral edema, no muscle wasting; dressing a/w right foot c/d/i. Neuro: strength and sensation intact in upper and lower extremities b/l    Labs on Admission: I have personally reviewed following labs and imaging studies  CBC: Recent Labs  Lab 01/18/21 1140  WBC 9.2  NEUTROABS 5.6  HGB 13.7  HCT 39.9  MCV 92.6  PLT 270   Basic Metabolic Panel: Recent Labs  Lab 01/18/21 1140  NA 136  K 3.4*  CL 99  CO2 27  GLUCOSE 111*  BUN 20  CREATININE 0.94  CALCIUM 10.0   GFR: Estimated Creatinine Clearance: 53.2 mL/min (by C-G formula based on SCr of 0.94 mg/dL). Liver Function Tests: Recent Labs  Lab 01/18/21 1140  AST 19  ALT 14  ALKPHOS 51  BILITOT 0.5  PROT 6.7  ALBUMIN 4.0   No results for input(s): LIPASE, AMYLASE in the last 168 hours. No results for input(s): AMMONIA in the last 168 hours. Coagulation Profile: No results for input(s): INR, PROTIME in the last 168 hours. Cardiac Enzymes: No results for  input(s): CKTOTAL, CKMB, CKMBINDEX, TROPONINI in the last 168 hours. BNP (last 3 results) No results for input(s): PROBNP in the last 8760 hours. HbA1C: No results for input(s): HGBA1C in the last 72 hours. CBG: No results for input(s): GLUCAP in the last 168 hours. Lipid Profile: No results for input(s): CHOL, HDL, LDLCALC, TRIG, CHOLHDL, LDLDIRECT in the last 72 hours. Thyroid Function Tests: No results for input(s): TSH, T4TOTAL, FREET4, T3FREE, THYROIDAB in the last 72 hours. Anemia Panel: No results for input(s): VITAMINB12, FOLATE, FERRITIN, TIBC, IRON, RETICCTPCT in the last 72 hours. Urine analysis:    Component Value Date/Time   COLORURINE YELLOW (A) 01/18/2021 1438   APPEARANCEUR CLEAR (A) 01/18/2021 1438   LABSPEC 1.013 01/18/2021 1438   PHURINE 6.0 01/18/2021 1438   GLUCOSEU NEGATIVE 01/18/2021 1438   HGBUR NEGATIVE 01/18/2021 1438   BILIRUBINUR NEGATIVE 01/18/2021 1438   KETONESUR NEGATIVE 01/18/2021 1438   PROTEINUR NEGATIVE 01/18/2021 1438   NITRITE NEGATIVE 01/18/2021 1438   LEUKOCYTESUR NEGATIVE 01/18/2021 1438    Radiological Exams on Admission: CT Head Wo Contrast  Result Date: 01/18/2021 CLINICAL DATA:  Onset abnormal speech today. EXAM: CT HEAD WITHOUT CONTRAST TECHNIQUE: Contiguous axial images were obtained from the base of the skull through the vertex without intravenous contrast. COMPARISON:  None. FINDINGS: Brain: No evidence of acute infarction, hemorrhage, hydrocephalus, extra-axial collection or mass lesion/mass effect. Dilated perivascular space on the left incidentally noted. Vascular: No hyperdense vessel or unexpected calcification. Skull: Intact.  No focal lesion. Sinuses/Orbits: Negative. Other: None. IMPRESSION: Negative head CT. Electronically Signed   By: Drusilla Kanner M.D.   On: 01/18/2021 13:17     Assessment/Plan   YASEMIN RABON is a 75 y.o. female with medical history significant for hypertension, prediabetes, hyperlipidemia,  depression, who is admitted to [facility] on 01/18/2021 for further evaluation and management of syncope after presenting from home to [facility] ED complaining of episode of loss of consciousness.   Principal Problem:   Syncope Active Problems:   Essential hypertension   Pure hypercholesterolemia   Hypokalemia   Dehydration   Acute prerenal azotemia    #) Syncope:  Single episode of apparent syncope occurring at approximately 10 AM on 01/18/2021 without evidence of prodrome, increasing likelihood for contributory arrhythmia.  The absence of prior traumatic features decrease the probability for vasovagal syncope versus orthostatic hypotension, although differential certainly continues to include these possibilities, particularly in the setting of clinical evidence for mild dehydration in the context of dry oral mucosa membranes as well as evidence of prerenal azotemia on the context of home HCTZ.  It does not appear that there was any pressure being applied by the hairdresser to the patient's on it at the time  of the syncopal event, thereby decreasing probability of carotid sinus hypersensitivity.  Not associate with any overt acute focal neurologic deficits, and presenting noncontrast CT that showed no evidence of acute intracranial process.  Clinically, acute ischemic stroke versus seizures appear less likely at this time.  While still in the differential, presentation appears less consistent with ACS at this time, with serial troponin values x2 found to be nonelevated, while presenting EKG shows no evidence of acute ischemic changes and the complete absence of any recent chest pain.  However, further trending of troponin is recommended to further rule out ACS.  Additionally, prior to administration of IV fluids, will check orthostatic vital signs.  Patient did not hit her head as a component of the presenting syncopal event.  In the context of a reported history of prediabetes will  also check hemoglobin A1c level to evaluate degree of glycemic control as an outpatient, with differential for presenting syncope including autonomic dysfunction in the setting of prediabetes.  Differential includes acute pulmonary embolism given recent surgical procedure in the form of bunionectomy performed 2 weeks ago, although, clinically, this possibility appears less likely at the present time.      Plan: I have placed a nursing communication order requesting that orthostatic vital signs x 1 set be checked and documented, following which will initiate gentle IV fluids in the form of lactated Ringer's at 75 cc/h x 12 hours. Monitor on telemetry. Hold home HCTZ.  Monitor strict I's and O's and daily weights.  Repeat CMP and CBC in the morning.  Add on serum magnesium level.  Repeat troponin level ordered for the morning.  Additionally, given the absence of any prior traumatic features, will also check echocardiogram in the morning.  Potassium supplementation in the setting of mild hypokalemia, as further described low.  Consider obtaining D-dimer.       #) Hypokalemia: Presenting labs reflect hypokalemia with serum potassium of 3.4.  The context of presenting syncope without prodromal features, raising the possibility of ventricular arrhythmia, will add potassium supplementation with goal to keep serum potassium level at greater than or equal to 4.0.   Plan: Potassium chloride 40 equivalents p.o. x1.  Repeat BMP in the morning.  Add on serum magnesium level, with plan to provide as needed supplementation in order to maintain magnesium level of greater than or equal to 2.0.  Monitor on telemetry.  Will recheck serum magnesium level in the morning.      #) Dehydration: Diagnosis on the basis of physical exam findings include dry oral mucosa membranes as well as laboratory findings include evidence of prerenal azotemia, and urinalysis found to be positive for hyaline casts, in the absence of  associated acute kidney injury.  This may represent a potential contributing factor leading to presenting syncope, with results of orthostatic vital signs currently pending.  Plan: Orthostatic vital signs x1 with associated documentation prior to initiation of lactated Ringer's at 75 cc/h x 12 hours.  Monitor strict I's and O's and daily weights.  Repeat BMP in the morning.      #) History of essential hypertension: On HCTZ as well patient antihypertensive agent.  Systolic blood pressures in the ED thus far have been in the range of the 130s to 140s mmHg.   Plan: In setting of presenting syncope, will hold home HCTZ for now.  Check 1 set of orthostatic vital signs with associated documentation of these findings, as further detailed above.  Close monitoring of ensuing blood pressure via routine  vital signs.       #) Hyperlipidemia: On simvastatin as an outpatient.  Plan: Continue home statin.      #) Depression: On Effexor as an outpatient.  Per my discussions with the ED physician, presenting EKG shows no evidence of QT prolongation.  Plan: Continue home Effexor.  Add on serum magnesium level.     #) GERD: On omeprazole as an outpatient.  Plan: Continue home PPI.     #) Right foot bunion: The patient reports that she is status post right bunionectomy performed 2 weeks ago at [facility], and that she is scheduled for her routine outpatient follow-up the operating podiatrist tomorrow (01/19/21) at 1400.  Sutures still in place at this time.  Patient denies any recent discharge from this operative site, and denies any recent worsening of the intensity of the postoperative discomfort.  Bilateral lower extremities appear neurovascularly intact.  Of note, I offered to place inpatient consult with our wound care team for assessment of current dressing, but the patient ultimately declined this offer.   Plan: Offered consultation of inpatient wound care, but this was declined by the patient,  as above.    DVT prophylaxis: scd's  Code Status: Full code Family Communication: The patient's case was discussed with her husband, who was present at bedside. Disposition Plan: Per Rounding Team Consults called: none  Admission status: Observation; med telemetry    Of note, this patient was added by me to the following Admit List/Treatment Team: armcadmits.      PLEASE NOTE THAT DRAGON DICTATION SOFTWARE WAS USED IN THE CONSTRUCTION OF THIS NOTE.   Angie Fava DO [facility] Pager [phone] From 12PM - 12AM  Otherwise, please contact night-coverage  [email or web address] Password [facility]   01/18/2021, 5:28 PM

## 2021-01-18 NOTE — ED Notes (Signed)
Pt signed paper copy of AMA form

## 2021-01-19 DIAGNOSIS — N19 Unspecified kidney failure: Secondary | ICD-10-CM | POA: Diagnosis present

## 2021-01-19 DIAGNOSIS — E860 Dehydration: Secondary | ICD-10-CM | POA: Diagnosis present

## 2021-01-19 DIAGNOSIS — E876 Hypokalemia: Secondary | ICD-10-CM | POA: Diagnosis present

## 2021-01-19 NOTE — Discharge Summary (Signed)
Physician Discharge Summary   OF NOTE, THE PATIENT LEFT AMA ON THE DAY OF ADMISSION.    Patient ID: Tara Reynolds MRN: [id number] DOB/AGE: [birth year] 75 y.o.  Admit date: 01/18/2021 Discharge date: 01/19/2021  Admission Diagnoses: Syncope  Discharge Diagnoses:  Principal Problem:   Syncope Active Problems:   Essential hypertension   Pure hypercholesterolemia   Hypokalemia   Dehydration   Acute prerenal azotemia   Discharged Condition: fair  Hospital Course:  Tara Reynolds is a 75 y.o. female with medical history significant for hypertension, prediabetes, hyperlipidemia, depression, who is admitted to [facility] on 01/18/2021 for further evaluation and management of syncope after presenting from home to [facility] ED complaining of episode of loss of consciousness.  Specifically, she experienced an apparent single episode of syncope without any prodromal features.  Etiology unclear at the time that the patient left AMA, in the absence of prodromal features associated with the syncopal event noted to be concerning for underlying arrhythmia, and this concern was conveyed onto the patient and her husband.   Hospital course, by problem, leading up to the patient leaving AMA on the day of admission, as outlined below:    #) Syncope:  Single episode of apparent syncope occurring at approximately 10 AM on 01/18/2021 without evidence of prodrome, increasing likelihood for contributory arrhythmia.  The absence of prior prodromal features decrease the probability for vasovagal syncope versus orthostatic hypotension, although differential certainly continues to include these possibilities, particularly in the setting of clinical evidence for mild dehydration in the context of dry oral mucosa membranes as well as evidence of prerenal azotemia, all in the setting of home HCTZ.  Acute ischemic stroke versus seizures felt to be less likely in the absence of any acute focal neurologic  deficits, with presenting CT head showing no evidence of acute intracranial process.  Additionally, no evidence of postictal confusion, tonic-clonic activity, tongue biting, or loss of bowel/bladder function to suggest contributory seizures.  Differential in the context of nonprodromal syncope included ACS.  EKG showed normal sinus rhythm with no evidence of acute ischemic changes, as documented in my H&P.  Of note, first to high-sensitivity troponin I value was found to be nonelevated, although trending up.  Plan included repeating troponin I value in the morning to further evaluate ACS, although the patient left AMA prior to Apatate hip pain this third serial enzyme.  Additionally, echocardiogram was ordered for the morning to evaluate for any structural abnormalities contributing to presenting syncope as well as to evaluate for any evidence of new focal wall motion abnormalities.  However, the patient left AMA prior to the opportunity to obtain this echocardiogram.  Orthostatic vital signs with instructions for documentation of such were ordered to further evaluate the possibility of orthostatic hypotension, although patient left AMA prior to the opportunity to check these orthostatic vital signs.  Additionally, the context of of dehydration, I had ordered maintenance IV fluids to be run overnight following the checking orthostatic vital signs, but the patient left AMA prior to these rehydration efforts.  Differential also included autonomic dysfunction in the setting of reported prediabetes.  In order to further evaluate degree of glycemic control as an outpatient, hemoglobin A1c was ordered, but was not obtained prior to the patient leaving AMA.  The differential also included the possibility of acute pulmonary embolism in the setting of recent bunionectomy.  There was consideration to checking D-dimer, with potential CTA depending upon the result of this lab.  To further  evaluate for any  underlying/contributory acute PE, but the patient left AMA prior to the updated pain a D-dimer.  Additionally, in order to reduce chance of ventricular arrhythmia, the patient was provided with oral potassium supplementation, as further detailed below.  Additionally, evaluation of the patient serum magnesium level led to finding of a value of 1.9, warranting IV magnesium infusion in order to maintain a value of greater than or equal to 2.0 in order to further reduce chances of ventricular arrhythmia however.  The patient left AMA prior to ability to administer this IV magnesium supplementation.  Of note, telemetry monitoring prior to the patient leaving AMA reportedly demonstrated no evidence of significant arrhythmia.       #) Hypokalemia: Presenting labs reflect hypokalemia with serum potassium of 3.4.  The context of presenting syncope without prodromal features, raising the possibility of ventricular arrhythmia, the patient was provided with supplementation in the form of potassium chloride 40 mEq p.o. x1, with repeat BMP ordered for the morning.  Serum magnesium level was added onto presenting labs, with ensuing value found to be 1.9, although the patient left AMA prior to opportunity to administer supplemental IV magnesium, as further detailed above.      #) Dehydration: Diagnosis on the basis of physical exam findings include dry oral mucosa membranes as well as laboratory findings include evidence of prerenal azotemia, and urinalysis found to be positive for hyaline casts, in the absence of associated acute kidney injury.  This may have represented a potential contributing factor leading to presenting syncope, although ensuing evaluation of this possibility was limited by the patient leaving AMA prior to obtaining orthostatic vital signs.  Additionally, she did not receive intended maintenance IV fluids that were ordered to start following the checking of orthostatic VS.       #) Right foot  bunion: The patient reports that she is status post right [facility], and that she is scheduled for her routine outpatient follow-up the operating podiatrist tomorrow (01/19/21) at 1400.  Sutures still in place at this time.  Patient denies any recent discharge from this operative site, and denies any recent worsening of the intensity of the postoperative discomfort.  Bilateral lower extremities appear neurovascularly intact.  Of note, I offered to place inpatient consult with our wound care team for assessment of current dressing, but the patient ultimately declined this offer.  I conveyed to the patient if she was unable to be discharged from the hospital in time to make her podiatry appointment in [place], [place] at 1400, that we would assist with rescheduling this appointment, such that she would either be seen by her outpatient podiatrist tomorrow or wait half definitive close follow-up the our rescheduling of this appointment.  The patient reports adequate pain control relating to her recent bunionectomy.      Of note, the patient elected to leave the hospital AGAINST MEDICAL ADVICE on the day of admission prior to completion of the proposed and initiated evaluation for presenting syncopal episode, with incomplete work-up including trending of serial troponin, echocardiogram, additional monitoring on telemetry for arrhythmia, D-dimer to assess for acute pulmonary embolism in the setting of presenting syncope and her recent surgical procedure.  I explained to the patient that in the absence of prodromal features, that her syncopal presentation was at increased risk for underlying arrhythmia, that could be fatal in nature.  Consequently, I recommended to her as well as her husband that the patient remain in the hospital  overnight for completion of the proposed work-up, including evaluation for underlying arrhythmia as well as acute pulmonary embolism, as further described above.  I  described the indications for remaining in the hospital overnight for completion of this work-up, including the benefits of detecting in this controlled the arm and any potential underlying arrhythmia versus acute pulmonary embolism, and also described the risk of leaving the hospital AMA prior to completion of this overnight work-up.  I described to the patient as well as her husband that these risks include the possibility of the absence of detection of an underlying, potentially fatal arrhythmia as well as the risk of undetected ACS in the absence of being able to complete the trending of serial troponin.  I explained to the patient and her husband leaving the hospital AMA prior to completion of the above proposed evaluation included death.  I strongly encouraged the patient to remain in the hospital for overnight observation to complete the above work-up, with tentative plan to discharge in the morning of 01/19/21.  The patient and her husband both verbalized their understanding of the above indications and benefits for meeting in the hospital overnight for further evaluation and management of presenting syncope and also verbalized their understanding of the risks of leaving AMA prior to completion of this evaluation, including their understanding that these risks include death.  Given my strong recommendation to the patient that she remain in the hospital overnight for further syncopal evaluation, I conveyed that if she were to leave the hospital prior to completion of this evaluation that it would need to be against medical advice.   Following all of these discussions, the patient immediately elected to leave AMA on the day of admission (01/18/21). She subsequently signed the associated AMA form before leaving the hospital AMA.        Consults: None  Significant Diagnostic Studies included the following: High-sensitivity troponin I x 2: Initial value found to be 3, with subsequent value trending up to  4.  Noncontrast CT head showed no evidence of acute intracranial process.     Treatments: Oral potassium supplementation, as outlined above  Discharge Exam: Blood pressure 126/76, pulse 72, temperature 97.7 F (36.5 C), temperature source Oral, resp. rate 17, height 5\' 5"  (1.651 m), weight 70.3 kg, SpO2 96 %.   Disposition: patient left AMA on 01/18/21, as further detailed above.   Allergies as of 01/18/2021      Reactions   Citrus Bioflavonoid Anaphylaxis, Hives, Rash, Shortness Of Breath   Melatonin Anaphylaxis, Rash   Other Anaphylaxis   Other reaction(s): Other (See Comments) All fruits except bananas- causes anaphylaxis Grass & trees- causes hay-fever like sx's Dog's and Cat's    Shellfish Allergy Anaphylaxis      Medication List    ASK your doctor about these medications   Aspirin Low Dose 81 MG EC tablet Generic drug: aspirin Take 81 mg by mouth daily.   Azelaic Acid 15 % cream Apply 1 application topically daily.   Calcium Carbonate-Vitamin D 600-400 MG-UNIT tablet Take 2 tablets by mouth daily.   estradiol 0.1 MG/GM vaginal cream Commonly known as: ESTRACE Place 1 g vaginally as needed.   fluocinonide 0.05 % external solution Commonly known as: LIDEX   hydrochlorothiazide 25 MG tablet Commonly known as: HYDRODIURIL Take 12.5 mg by mouth daily.   HYDROcodone-acetaminophen 5-325 MG tablet Commonly known as: NORCO/VICODIN Take 1 tablet by mouth every 4 (four) hours as needed for pain.   ibuprofen 800 MG tablet Commonly  known as: ADVIL Take 800 mg by mouth every 8 (eight) hours as needed for pain.   loratadine 10 MG tablet Commonly known as: CLARITIN Take 1 tablet by mouth as needed.   simvastatin 20 MG tablet Commonly known as: ZOCOR Take 1 tablet by mouth daily.   venlafaxine XR 150 MG 24 hr capsule Commonly known as: EFFEXOR-XR Take 150 mg by mouth daily with breakfast.        Signed: Angie Fava 01/19/2021, 2:45 AM

## 2021-05-10 ENCOUNTER — Ambulatory Visit: Admit: 2021-05-10 | Payer: Medicare Other | Admitting: Ophthalmology

## 2021-05-10 SURGERY — PHACOEMULSIFICATION, CATARACT, WITH IOL INSERTION
Anesthesia: Topical | Laterality: Left

## 2021-05-24 ENCOUNTER — Ambulatory Visit: Admit: 2021-05-24 | Payer: Medicare Other | Admitting: Ophthalmology

## 2021-05-24 SURGERY — PHACOEMULSIFICATION, CATARACT, WITH IOL INSERTION
Anesthesia: Topical | Laterality: Right

## 2022-06-04 ENCOUNTER — Other Ambulatory Visit
Admission: RE | Admit: 2022-06-04 | Discharge: 2022-06-04 | Disposition: A | Discharge status: Home / Self Care | Payer: Medicare Other | Source: Ambulatory Visit | Attending: Family Medicine | Admitting: Family Medicine

## 2022-06-04 DIAGNOSIS — R0789 Other chest pain: Secondary | ICD-10-CM | POA: Insufficient documentation

## 2022-06-04 LAB — D-DIMER, QUANTITATIVE: D-Dimer, Quant: 0.38 ug/mL-FEU (ref 0.00–0.50)

## 2022-08-04 IMAGING — RF DG ESOPHAGUS
11 of 13 series · 14 of 20 positions shown · non-contrast
Comparison: No prior.

CLINICAL DATA: Dysphagia.

EXAM:
ESOPHOGRAM / BARIUM SWALLOW / BARIUM TABLET STUDY
TECHNIQUE: Combined double contrast and single contrast examination performed
using effervescent crystals, thick barium liquid, and thin barium
liquid. The patient was observed with fluoroscopy swallowing a 13 mm
barium sulphate tablet.
FLUOROSCOPY TIME:  Fluoroscopy Time:  1 minutes 18 seconds
Radiation Exposure Index (if provided by the fluoroscopic device):
31.3 mGy

[Series 1: fluoro_barium 2fps_bw · 0.17mm/px · 2 of 11 frames shown (1 of 11)]
[frame 2/11]
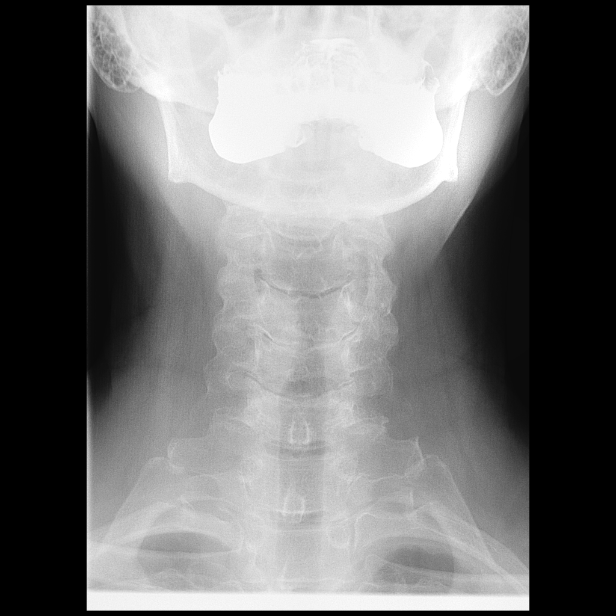
[frame 10/11]
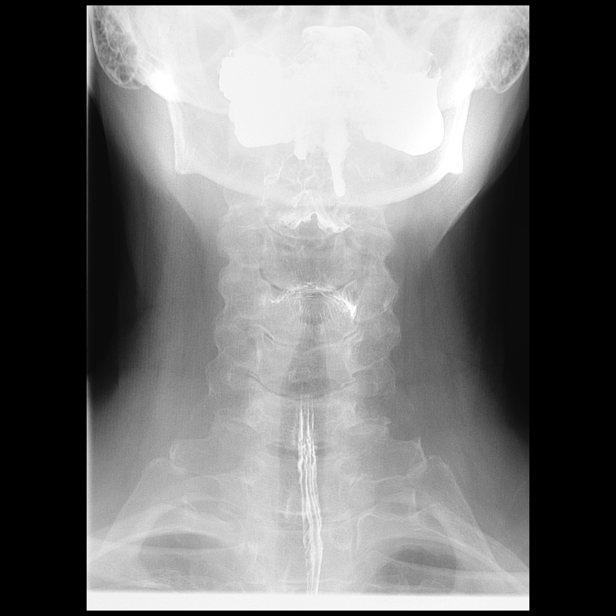

[Series 2: fluoro_barium 2fps_bw · 0.17mm/px · 2 of 6 frames shown (2 of 11)]
[frame 1/6]
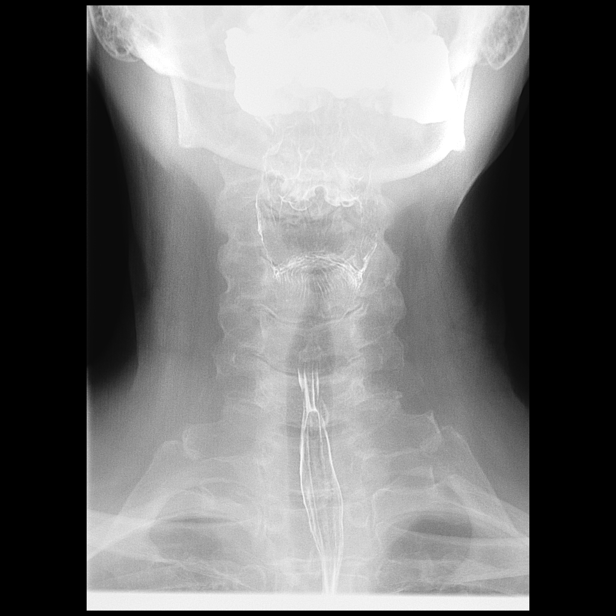
[frame 6/6]
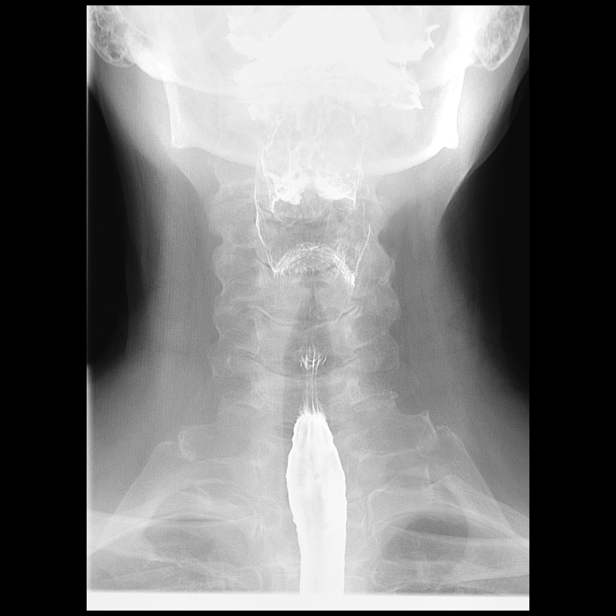

[Series 3: fluoro_barium 2fps_bw · 0.17mm/px · 2 of 7 frames shown (3 of 11)]
[frame 2/7]
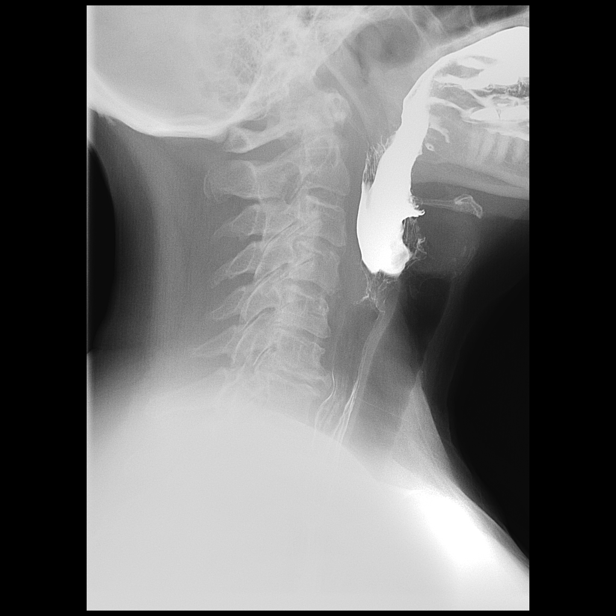
[frame 4/7]
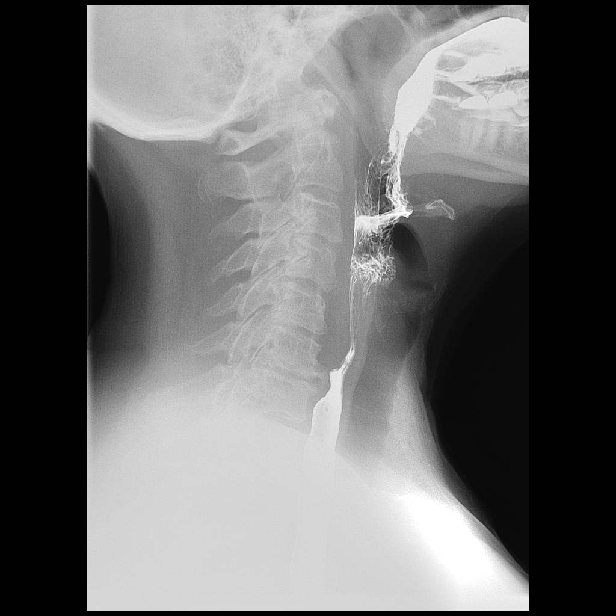

[Series 4: fluoro_barium 2fps_bw · 0.17mm/px · 1 of 1 slices shown (4 of 11)]
[im 1/1]
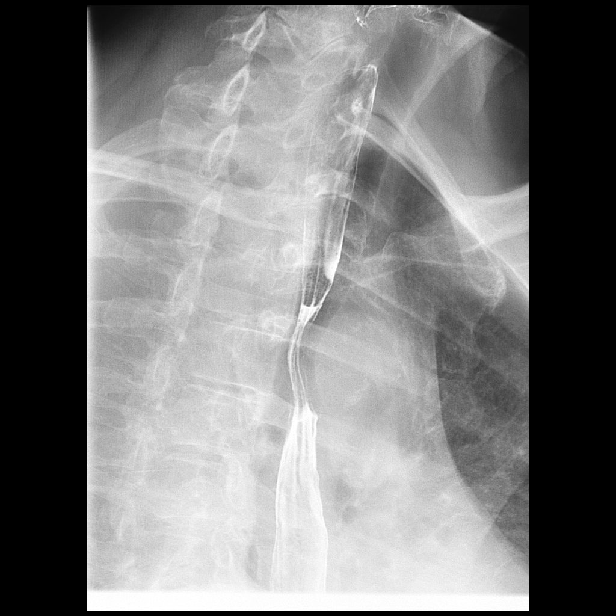

[Series 5: fluoro_barium 2fps_bw · 0.17mm/px · 1 of 1 slices shown (5 of 11)]
[im 1/1]
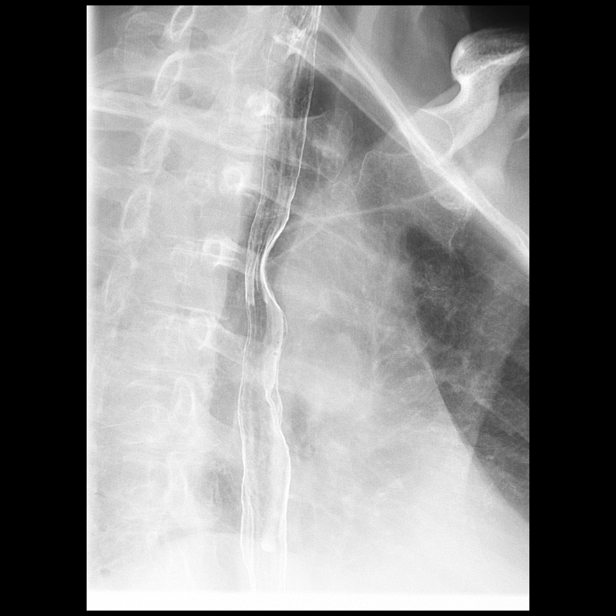

[Series 7: fluoro_barium 2fps_bw · 0.17mm/px · 1 of 1 slices shown (6 of 11)]
[im 1/1]
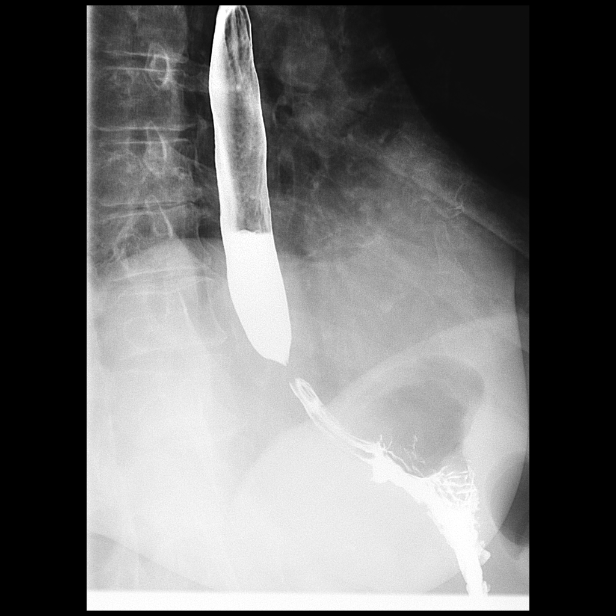

[Series 8: fluoro_barium 2fps_bw · 0.17mm/px · 1 of 1 slices shown (7 of 11)]
[im 1/1]
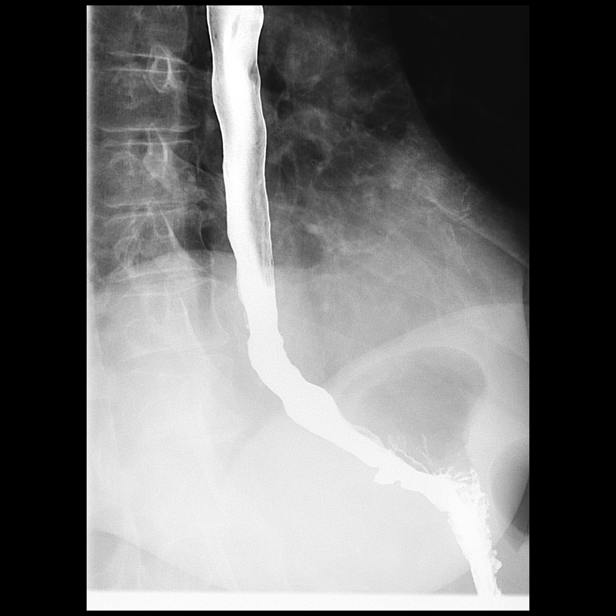

[Series 10: fluoro_barium 2fps_bw · 0.18mm/px · 1 of 1 slices shown (8 of 11)]
[im 1/1]
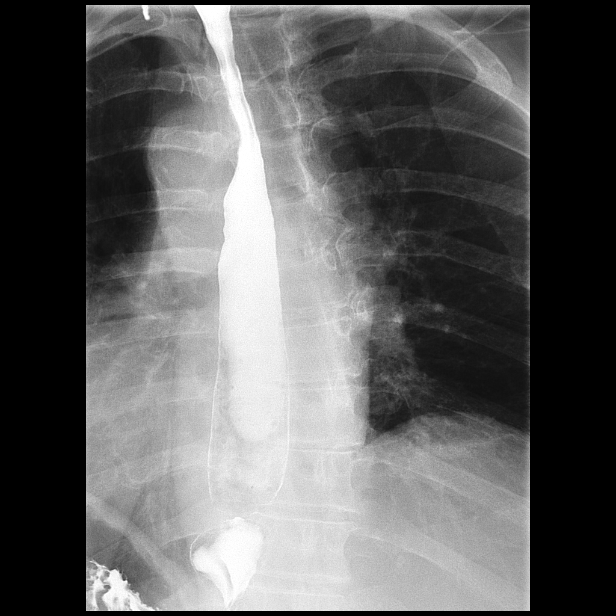

[Series 11: fluoro_barium 2fps_bw · 0.18mm/px · 1 of 1 slices shown (9 of 11)]
[im 1/1]
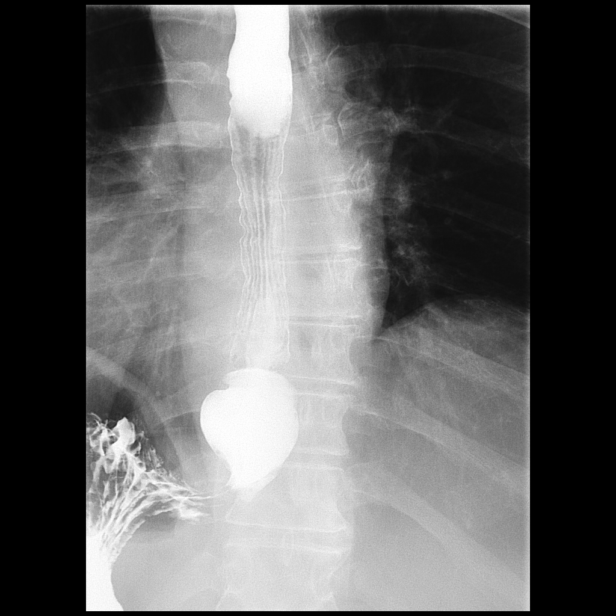

[Series 12: fluoro_barium 2fps_bw · 0.18mm/px · 1 of 2 frames shown (10 of 11)]
[frame 1/2]
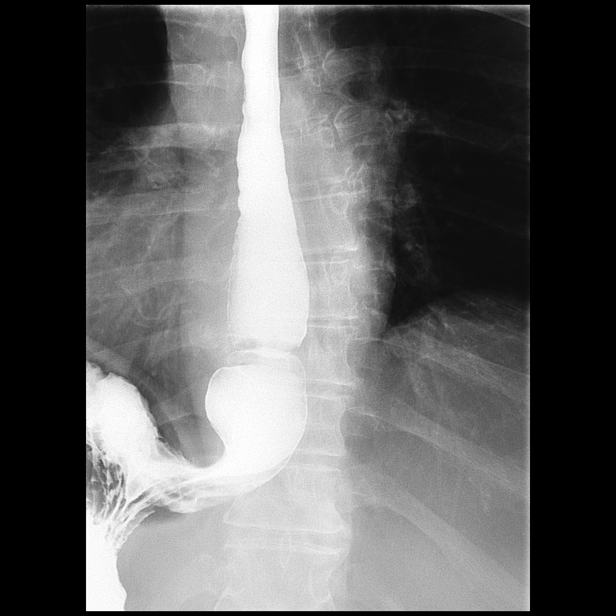

[Series 13: fluoro_barium 2fps_bw · 0.19mm/px · 1 of 1 slices shown (11 of 11)]
[im 1/1]
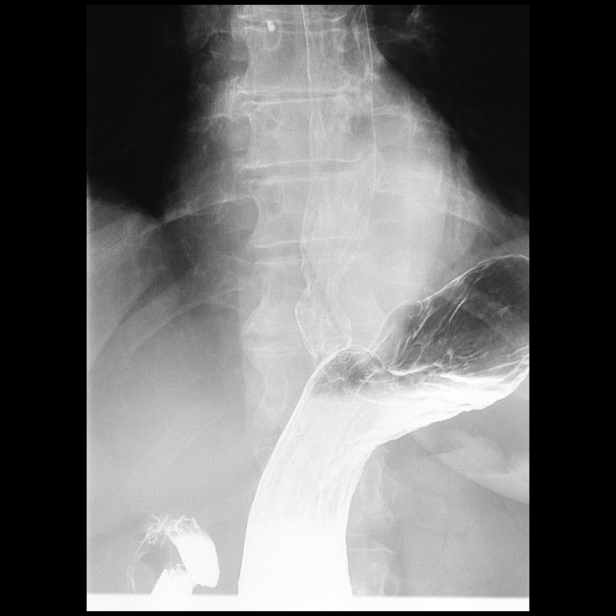

[14 of 20 positions shown; findings below may reference images not displayed]

FINDINGS: Cervical esophagus is widely patent. Small hiatal hernia with mild
esophageal B ring noted. No obstructing abnormality. Standard barium
tablet passes easily. Peristalsis normal. No reflux.
IMPRESSION: Cervical esophagus is normal. No aspiration. Small hiatal hernia
with mild B ring. No evidence of obstruction. Standard barium tablet
passes normally. No reflux.

## 2022-08-27 ENCOUNTER — Encounter: Payer: Self-pay | Admitting: Ophthalmology

## 2022-08-29 NOTE — Discharge Instructions (Signed)

## 2022-09-04 ENCOUNTER — Ambulatory Visit
Admission: RE | Admit: 2022-09-04 | Discharge: 2022-09-04 | Disposition: A | Discharge status: Home / Self Care | Payer: Medicare Other | Attending: Ophthalmology | Admitting: Ophthalmology

## 2022-09-04 ENCOUNTER — Encounter
Admission: RE | Disposition: A | Discharge status: Home / Self Care | Payer: Self-pay | Source: Home / Self Care | Attending: Ophthalmology

## 2022-09-04 ENCOUNTER — Other Ambulatory Visit: Payer: Self-pay

## 2022-09-04 ENCOUNTER — Ambulatory Visit: Payer: Medicare Other | Admitting: Anesthesiology

## 2022-09-04 ENCOUNTER — Encounter: Payer: Self-pay | Admitting: Ophthalmology

## 2022-09-04 DIAGNOSIS — E1136 Type 2 diabetes mellitus with diabetic cataract: Secondary | ICD-10-CM | POA: Insufficient documentation

## 2022-09-04 HISTORY — DX: Unspecified osteoarthritis, unspecified site: M19.90

## 2022-09-04 HISTORY — PX: CATARACT EXTRACTION W/PHACO: SHX586

## 2022-09-04 HISTORY — DX: Rosacea, unspecified: L71.9

## 2022-09-04 SURGERY — PHACOEMULSIFICATION, CATARACT, WITH IOL INSERTION
Anesthesia: Monitor Anesthesia Care | Site: Eye | Laterality: Left

## 2022-09-04 MED ORDER — TETRACAINE HCL 0.5 % OP SOLN
1.0000 [drp] | OPHTHALMIC | Status: DC | PRN
  Administered 2022-09-04 (×3): 1 [drp] via OPHTHALMIC

## 2022-09-04 MED ORDER — SIGHTPATH DOSE#1 BSS IO SOLN
INTRAOCULAR | Status: DC | PRN
  Administered 2022-09-04: 2 mL

## 2022-09-04 MED ORDER — MIDAZOLAM HCL 2 MG/2ML IJ SOLN
INTRAMUSCULAR | Status: DC | PRN
  Administered 2022-09-04 (×2): 1 mg via INTRAVENOUS

## 2022-09-04 MED ORDER — LACTATED RINGERS IV SOLN: INTRAVENOUS | Status: DC

## 2022-09-04 MED ORDER — ACETAMINOPHEN 160 MG/5ML PO SOLN: 325.0000 mg | ORAL | Status: DC | PRN

## 2022-09-04 MED ORDER — ARMC OPHTHALMIC DILATING DROPS
1.0000 | OPHTHALMIC | Status: DC | PRN
Start: 2022-09-04 — End: 2022-09-04
  Administered 2022-09-04 (×3): 1 via OPHTHALMIC

## 2022-09-04 MED ORDER — FENTANYL CITRATE (PF) 100 MCG/2ML IJ SOLN
INTRAMUSCULAR | Status: DC | PRN
  Administered 2022-09-04: 100 ug via INTRAVENOUS

## 2022-09-04 MED ORDER — SIGHTPATH DOSE#1 BSS IO SOLN
INTRAOCULAR | Status: DC | PRN
  Administered 2022-09-04: 55 mL via OPHTHALMIC

## 2022-09-04 MED ORDER — KETOROLAC TROMETHAMINE 30 MG/ML IJ SOLN: 15.0000 mg | Freq: Once | INTRAMUSCULAR | Status: DC | PRN

## 2022-09-04 MED ORDER — MOXIFLOXACIN HCL 0.5 % OP SOLN
OPHTHALMIC | Status: DC | PRN
  Administered 2022-09-04: 0.2 mL via OPHTHALMIC

## 2022-09-04 MED ORDER — SIGHTPATH DOSE#1 BSS IO SOLN
INTRAOCULAR | Status: DC | PRN
  Administered 2022-09-04: 15 mL via INTRAOCULAR

## 2022-09-04 MED ORDER — BRIMONIDINE TARTRATE-TIMOLOL 0.2-0.5 % OP SOLN
OPHTHALMIC | Status: DC | PRN
  Administered 2022-09-04: 1 [drp] via OPHTHALMIC

## 2022-09-04 MED ORDER — SIGHTPATH DOSE#1 NA CHONDROIT SULF-NA HYALURON 40-17 MG/ML IO SOLN
INTRAOCULAR | Status: DC | PRN
  Administered 2022-09-04: 1 mL via INTRAOCULAR

## 2022-09-04 MED ORDER — ACETAMINOPHEN 325 MG PO TABS: 325.0000 mg | ORAL_TABLET | ORAL | Status: DC | PRN

## 2022-09-04 SURGICAL SUPPLY — 11 items
CANNULA ANT/CHMB 27G (MISCELLANEOUS) IMPLANT
CANNULA ANT/CHMB 27GA (MISCELLANEOUS) IMPLANT
CATARACT SUITE SIGHTPATH (MISCELLANEOUS) ×1 IMPLANT
FEE CATARACT SUITE SIGHTPATH (MISCELLANEOUS) ×1 IMPLANT
GLOVE SURG ENC TEXT LTX SZ8 (GLOVE) ×1 IMPLANT
GLOVE SURG TRIUMPH 8.0 PF LTX (GLOVE) ×1 IMPLANT
LENS IOL TECNIS EYHANCE 21.5 (Intraocular Lens) IMPLANT
NDL FILTER BLUNT 18X1 1/2 (NEEDLE) ×1 IMPLANT
NEEDLE FILTER BLUNT 18X1 1/2 (NEEDLE) ×1 IMPLANT
SYR 3ML LL SCALE MARK (SYRINGE) ×1 IMPLANT
WATER STERILE IRR 250ML POUR (IV SOLUTION) ×1 IMPLANT

## 2022-09-04 NOTE — Anesthesia Preprocedure Evaluation (Addendum)
Anesthesia Evaluation  Patient identified by MRN, date of birth, ID band Patient awake    Reviewed: Allergy & Precautions, NPO status , Patient's Chart, lab work & pertinent test results  History of Anesthesia Complications Negative for: history of anesthetic complications  Airway Mallampati: II  TM Distance: >3 FB Neck ROM: Full    Dental  (+) Teeth Intact   Pulmonary shortness of breath (Has been having shortness of breath since her COVID infection, no chest pain associated, has recently had cardiology workup and it was negative per patient ) and with exertion, former smoker,    Pulmonary exam normal breath sounds clear to auscultation       Cardiovascular Exercise Tolerance: Poor hypertension, Pt. on medications Normal cardiovascular exam Rhythm:Regular Rate:Normal  Exercise stress test (08/2022)  Normal Lexiscan infusion EKG  Normal myocardial perfusion without evidence of myocardial ischemia     Neuro/Psych PSYCHIATRIC DISORDERS Depression negative neurological ROS     GI/Hepatic negative GI ROS, Neg liver ROS,   Endo/Other  negative endocrine ROS  Renal/GU negative Renal ROS  negative genitourinary   Musculoskeletal negative musculoskeletal ROS (+)   Abdominal   Peds negative pediatric ROS (+)  Hematology negative hematology ROS (+)   Anesthesia Other Findings   Reproductive/Obstetrics negative OB ROS                           Anesthesia Physical Anesthesia Plan  ASA: 2  Anesthesia Plan: MAC   Post-op Pain Management: Minimal or no pain anticipated   Induction: Intravenous  PONV Risk Score and Plan: 2 and Ondansetron and Treatment may vary due to age or medical condition  Airway Management Planned: Natural Airway and Nasal Cannula  Additional Equipment:   Intra-op Plan:   Post-operative Plan:   Informed Consent: I have reviewed the patients History and Physical,  chart, labs and discussed the procedure including the risks, benefits and alternatives for the proposed anesthesia with the patient or authorized representative who has indicated his/her understanding and acceptance.     Dental Advisory Given  Plan Discussed with: Anesthesiologist, CRNA and Surgeon  Anesthesia Plan Comments: (Patient consented for risks of anesthesia including but not limited to:  - adverse reactions to medications - damage to eyes, teeth, lips or other oral mucosa - nerve damage due to positioning  - sore throat or hoarseness - Damage to heart, brain, nerves, lungs, other parts of body or loss of life  Patient voiced understanding.)        Anesthesia Quick Evaluation

## 2022-09-04 NOTE — Anesthesia Postprocedure Evaluation (Signed)
Anesthesia Post Note  Patient: Tara Reynolds  Procedure(s) Performed: CATARACT EXTRACTION PHACO AND INTRAOCULAR LENS PLACEMENT (IOC) LEFT DIABETIC 6.06 00:44.3 (Left: Eye)  Patient location during evaluation: PACU Anesthesia Type: MAC Level of consciousness: awake and alert Pain management: pain level controlled Vital Signs Assessment: post-procedure vital signs reviewed and stable Respiratory status: spontaneous breathing, nonlabored ventilation, respiratory function stable and patient connected to nasal cannula oxygen Cardiovascular status: stable and blood pressure returned to baseline Postop Assessment: no apparent nausea or vomiting Anesthetic complications: no   No notable events documented.   Last Vitals:  Vitals:   09/04/22 0750 09/04/22 0754  BP: 112/86 128/68  Pulse: 81 74  Resp: 15 17  Temp: (!) 36.3 C (!) 36.3 C  SpO2: 96% 96%    Last Pain:  Vitals:   09/04/22 0754  TempSrc:   PainSc: 0-No pain                 Ilene Qua

## 2022-09-04 NOTE — Op Note (Signed)
PREOPERATIVE DIAGNOSIS:  Nuclear sclerotic cataract of the left eye.   POSTOPERATIVE DIAGNOSIS:  Nuclear sclerotic cataract of the left eye.   OPERATIVE PROCEDURE:ORPROCALL@   SURGEON:  Birder Robson, MD.   ANESTHESIA:  Anesthesiologist: Ilene Qua, MD CRNA: Tobie Poet, CRNA  1.      Managed anesthesia care. 2.     0.41ml of Shugarcaine was instilled following the paracentesis   COMPLICATIONS:  None.   TECHNIQUE:   Stop and chop   DESCRIPTION OF PROCEDURE:  The patient was examined and consented in the preoperative holding area where the aforementioned topical anesthesia was applied to the left eye and then brought back to the Operating Room where the left eye was prepped and draped in the usual sterile ophthalmic fashion and a lid speculum was placed. A paracentesis was created with the side port blade and the anterior chamber was filled with viscoelastic. A near clear corneal incision was performed with the steel keratome. A continuous curvilinear capsulorrhexis was performed with a cystotome followed by the capsulorrhexis forceps. Hydrodissection and hydrodelineation were carried out with BSS on a blunt cannula. The lens was removed in a stop and chop  technique and the remaining cortical material was removed with the irrigation-aspiration handpiece. The capsular bag was inflated with viscoelastic and the Technis [id number] lens was placed in the capsular bag without complication. The remaining viscoelastic was removed from the eye with the irrigation-aspiration handpiece. The wounds were hydrated. The anterior chamber was flushed with BSS and the eye was inflated to physiologic pressure. 0.43ml Vigamox was placed in the anterior chamber. The wounds were found to be water tight. The eye was dressed with Combigan. The patient was given protective glasses to wear throughout the day and a shield with which to sleep tonight. The patient was also given drops with which to begin a drop  regimen today and will follow-up with me in one day. Implant Name Type Inv. Item Serial No. Manufacturer Lot No. LRB No. Used Action  LENS IOL TECNIS EYHANCE 21.5 - [id number] Intraocular Lens LENS IOL TECNIS EYHANCE 21.5 [id number] [employer]  Left 1 Implanted    Procedure(s): CATARACT EXTRACTION PHACO AND INTRAOCULAR LENS PLACEMENT (IOC) LEFT DIABETIC 6.06 00:44.3 (Left)  Electronically signed: Birder Robson 09/04/2022 7:48 AM

## 2022-09-04 NOTE — Transfer of Care (Signed)
Immediate Anesthesia Transfer of Care Note  Patient: Tara Reynolds  Procedure(s) Performed: CATARACT EXTRACTION PHACO AND INTRAOCULAR LENS PLACEMENT (IOC) LEFT DIABETIC 6.06 00:44.3 (Left: Eye)  Patient Location: PACU  Anesthesia Type: MAC  Level of Consciousness: awake, alert  and patient cooperative  Airway and Oxygen Therapy: Patient Spontanous Breathing and Patient connected to supplemental oxygen  Post-op Assessment: Post-op Vital signs reviewed, Patient's Cardiovascular Status Stable, Respiratory Function Stable, Patent Airway and No signs of Nausea or vomiting  Post-op Vital Signs: Reviewed and stable  Complications: No notable events documented.

## 2022-09-04 NOTE — Addendum Note (Signed)
Addendum  created 09/04/22 1048 by Tobie Poet, CRNA   Intraprocedure Meds edited

## 2022-09-04 NOTE — H&P (Signed)
[facility]   Primary Care Physician:  Marisue Ivan, MD Ophthalmologist: Dr. Druscilla Brownie  Pre-Procedure History & Physical: HPI:  Tara Reynolds is a 76 y.o. female here for cataract surgery.   Past Medical History:  Diagnosis Date   Arthritis    Hypertension    Rosacea     Past Surgical History:  Procedure Laterality Date   FOOT SURGERY     HAND SURGERY      Prior to Admission medications   Medication Sig Start Date End Date Taking? Authorizing Provider  Azelaic Acid 15 % cream Apply 1 application topically daily. 12/22/20  Yes [provider]  azelastine (OPTIVAR) 0.05 % ophthalmic solution 1 drop 2 (two) times daily.   Yes [provider]  Biotin 5 MG TABS Take by mouth daily.   Yes [provider]  Calcium Carbonate-Vitamin D 600-400 MG-UNIT tablet Take 2 tablets by mouth daily.   Yes [provider]  docusate sodium (COLACE) 100 MG capsule Take 100 mg by mouth 2 (two) times daily as needed for mild constipation.   Yes [provider]  fluticasone (FLONASE) 50 MCG/ACT nasal spray Place into both nostrils daily.   Yes [provider]  loratadine (CLARITIN) 10 MG tablet Take 1 tablet by mouth as needed.   Yes [provider]  losartan (COZAAR) 50 MG tablet Take 50 mg by mouth daily.   Yes [provider]  naproxen sodium (ALEVE) 220 MG tablet Take 220 mg by mouth daily as needed.   Yes [provider]  simvastatin (ZOCOR) 20 MG tablet Take 1 tablet by mouth daily. 09/18/20  Yes [provider]  venlafaxine XR (EFFEXOR-XR) 150 MG 24 hr capsule Take 150 mg by mouth daily with breakfast.   Yes [provider]  estradiol (ESTRACE) 0.1 MG/GM vaginal cream Place 1 g vaginally as needed. Patient not taking: Reported on 08/27/2022 09/29/20   [provider]    Allergies as of 07/17/2022 - Review Complete 01/18/2021  Allergen Reaction Noted   Citrus bioflavonoid  Anaphylaxis, Hives, Rash, and Shortness Of Breath 11/01/2020   Melatonin Anaphylaxis and Rash 03/14/2016   Other Anaphylaxis 12/06/2014   Shellfish allergy Anaphylaxis 02/08/2016    History reviewed. No pertinent family history.  Social History   Socioeconomic History   Marital status: Married    Spouse name: Not on file   Number of children: Not on file   Years of education: Not on file   Highest education level: Not on file  Occupational History   Not on file  Tobacco Use   Smoking status: Former    Types: Cigarettes    Quit date: 1980    Years since quitting: 43.7   Smokeless tobacco: Never  Vaping Use   Vaping Use: Never used  Substance and Sexual Activity   Alcohol use: Not Currently   Drug use: Not on file   Sexual activity: Not on file  Other Topics Concern   Not on file  Social History Narrative   Not on file   Social Determinants of Health   Financial Resource Strain: Not on file  Food Insecurity: Not on file  Transportation Needs: Not on file  Physical Activity: Not on file  Stress: Not on file  Social Connections: Not on file  Intimate Partner Violence: Not on file    Review of Systems: See HPI, otherwise negative ROS  Physical Exam: BP 115/77   Pulse 75   Temp (!) 97 F (36.1  C) (Temporal)   Resp 11   Ht 5\' 6"  (1.676 m)   Wt 83 kg   SpO2 94%   BMI 29.54 kg/m  General:   Alert, cooperative in NAD Head:  Normocephalic and atraumatic. Respiratory:  Normal work of breathing. Cardiovascular:  RRR  Impression/Plan: Tara Reynolds is here for cataract surgery.  Risks, benefits, limitations, and alternatives regarding cataract surgery have been reviewed with the patient.  Questions have been answered.  All parties agreeable.   Birder Robson, MD  09/04/2022, 7:17 AM

## 2022-09-05 ENCOUNTER — Encounter: Payer: Self-pay | Admitting: Ophthalmology

## 2022-09-06 ENCOUNTER — Other Ambulatory Visit: Payer: Self-pay

## 2022-09-06 ENCOUNTER — Encounter: Payer: Self-pay | Admitting: Ophthalmology

## 2022-09-17 NOTE — Discharge Instructions (Signed)

## 2022-09-18 ENCOUNTER — Ambulatory Visit: Payer: Medicare Other | Admitting: Anesthesiology

## 2022-09-18 ENCOUNTER — Ambulatory Visit
Admission: RE | Admit: 2022-09-18 | Discharge: 2022-09-18 | Disposition: A | Discharge status: Home / Self Care | Payer: Medicare Other | Attending: Ophthalmology | Admitting: Ophthalmology

## 2022-09-18 ENCOUNTER — Encounter
Admission: RE | Disposition: A | Discharge status: Home / Self Care | Payer: Self-pay | Source: Home / Self Care | Attending: Ophthalmology

## 2022-09-18 ENCOUNTER — Other Ambulatory Visit: Payer: Self-pay

## 2022-09-18 ENCOUNTER — Encounter: Payer: Self-pay | Admitting: Ophthalmology

## 2022-09-18 DIAGNOSIS — E1136 Type 2 diabetes mellitus with diabetic cataract: Secondary | ICD-10-CM | POA: Diagnosis present

## 2022-09-18 DIAGNOSIS — Z8616 Personal history of COVID-19: Secondary | ICD-10-CM | POA: Diagnosis not present

## 2022-09-18 DIAGNOSIS — H2511 Age-related nuclear cataract, right eye: Secondary | ICD-10-CM | POA: Insufficient documentation

## 2022-09-18 DIAGNOSIS — Z87891 Personal history of nicotine dependence: Secondary | ICD-10-CM | POA: Insufficient documentation

## 2022-09-18 DIAGNOSIS — I10 Essential (primary) hypertension: Secondary | ICD-10-CM | POA: Insufficient documentation

## 2022-09-18 HISTORY — PX: CATARACT EXTRACTION W/PHACO: SHX586

## 2022-09-18 SURGERY — PHACOEMULSIFICATION, CATARACT, WITH IOL INSERTION
Anesthesia: Monitor Anesthesia Care | Site: Eye | Laterality: Right

## 2022-09-18 MED ORDER — SIGHTPATH DOSE#1 BSS IO SOLN
INTRAOCULAR | Status: DC | PRN
  Administered 2022-09-18: 59 mL via OPHTHALMIC

## 2022-09-18 MED ORDER — MOXIFLOXACIN HCL 0.5 % OP SOLN
OPHTHALMIC | Status: DC | PRN
  Administered 2022-09-18: 0.2 mL via OPHTHALMIC

## 2022-09-18 MED ORDER — BRIMONIDINE TARTRATE-TIMOLOL 0.2-0.5 % OP SOLN
OPHTHALMIC | Status: DC | PRN
  Administered 2022-09-18: 1 [drp] via OPHTHALMIC

## 2022-09-18 MED ORDER — TETRACAINE HCL 0.5 % OP SOLN
1.0000 [drp] | OPHTHALMIC | Status: DC | PRN
  Administered 2022-09-18 (×3): 1 [drp] via OPHTHALMIC

## 2022-09-18 MED ORDER — SIGHTPATH DOSE#1 BSS IO SOLN
INTRAOCULAR | Status: DC | PRN
  Administered 2022-09-18: 1 mL

## 2022-09-18 MED ORDER — ARMC OPHTHALMIC DILATING DROPS
1.0000 | OPHTHALMIC | Status: DC | PRN
  Administered 2022-09-18 (×3): 1 via OPHTHALMIC

## 2022-09-18 MED ORDER — FENTANYL CITRATE (PF) 100 MCG/2ML IJ SOLN
INTRAMUSCULAR | Status: DC | PRN
  Administered 2022-09-18: 50 ug via INTRAVENOUS

## 2022-09-18 MED ORDER — SIGHTPATH DOSE#1 BSS IO SOLN
INTRAOCULAR | Status: DC | PRN
  Administered 2022-09-18: 15 mL

## 2022-09-18 MED ORDER — SIGHTPATH DOSE#1 NA CHONDROIT SULF-NA HYALURON 40-17 MG/ML IO SOLN
INTRAOCULAR | Status: DC | PRN
  Administered 2022-09-18: 1 mL via INTRAOCULAR

## 2022-09-18 MED ORDER — LACTATED RINGERS IV SOLN: INTRAVENOUS | Status: DC

## 2022-09-18 MED ORDER — MIDAZOLAM HCL 2 MG/2ML IJ SOLN
INTRAMUSCULAR | Status: DC | PRN
  Administered 2022-09-18: 1 mg via INTRAVENOUS

## 2022-09-18 SURGICAL SUPPLY — 15 items
CANNULA ANT/CHMB 27G (MISCELLANEOUS) IMPLANT
CANNULA ANT/CHMB 27GA (MISCELLANEOUS) IMPLANT
CATARACT SUITE SIGHTPATH (MISCELLANEOUS) ×1 IMPLANT
FEE CATARACT SUITE SIGHTPATH (MISCELLANEOUS) ×1 IMPLANT
GLOVE SURG ENC TEXT LTX SZ8 (GLOVE) ×1 IMPLANT
GLOVE SURG TRIUMPH 8.0 PF LTX (GLOVE) ×1 IMPLANT
LENS IOL TECNIS EYHANCE 21.0 (Intraocular Lens) IMPLANT
NDL FILTER BLUNT 18X1 1/2 (NEEDLE) ×1 IMPLANT
NEEDLE FILTER BLUNT 18X1 1/2 (NEEDLE) ×1 IMPLANT
PACK VIT ANT 23G (MISCELLANEOUS) IMPLANT
RING MALYGIN (MISCELLANEOUS) IMPLANT
SUT ETHILON 10-0 CS-B-6CS-B-6 (SUTURE)
SUTURE EHLN 10-0 CS-B-6CS-B-6 (SUTURE) IMPLANT
SYR 3ML LL SCALE MARK (SYRINGE) ×1 IMPLANT
WATER STERILE IRR 250ML POUR (IV SOLUTION) ×1 IMPLANT

## 2022-09-18 NOTE — Anesthesia Preprocedure Evaluation (Signed)
Anesthesia Evaluation  Patient identified by MRN, date of birth, ID band Patient awake    Reviewed: Allergy & Precautions, NPO status , Patient's Chart, lab work & pertinent test results  History of Anesthesia Complications Negative for: history of anesthetic complications  Airway Mallampati: II  TM Distance: >3 FB Neck ROM: Full    Dental  (+) Teeth Intact   Pulmonary shortness of breath (Has been having shortness of breath since her COVID infection, no chest pain associated, has recently had cardiology workup and it was negative per patient ) and with exertion, former smoker,    Pulmonary exam normal breath sounds clear to auscultation       Cardiovascular Exercise Tolerance: Poor hypertension, Pt. on medications Normal cardiovascular exam Rhythm:Regular Rate:Normal  Exercise stress test (08/2022)  Normal Lexiscan infusion EKG  Normal myocardial perfusion without evidence of myocardial ischemia     Neuro/Psych PSYCHIATRIC DISORDERS Depression negative neurological ROS     GI/Hepatic negative GI ROS, Neg liver ROS,   Endo/Other  negative endocrine ROS  Renal/GU negative Renal ROS  negative genitourinary   Musculoskeletal negative musculoskeletal ROS (+)   Abdominal   Peds negative pediatric ROS (+)  Hematology negative hematology ROS (+)   Anesthesia Other Findings   Reproductive/Obstetrics negative OB ROS                             Anesthesia Physical  Anesthesia Plan  ASA: 2  Anesthesia Plan: MAC   Post-op Pain Management: Minimal or no pain anticipated   Induction: Intravenous  PONV Risk Score and Plan: 2 and Ondansetron and Treatment may vary due to age or medical condition  Airway Management Planned: Natural Airway and Nasal Cannula  Additional Equipment:   Intra-op Plan:   Post-operative Plan:   Informed Consent: I have reviewed the patients History and  Physical, chart, labs and discussed the procedure including the risks, benefits and alternatives for the proposed anesthesia with the patient or authorized representative who has indicated his/her understanding and acceptance.     Dental Advisory Given  Plan Discussed with: Anesthesiologist, CRNA and Surgeon  Anesthesia Plan Comments: (Patient consented for risks of anesthesia including but not limited to:  - adverse reactions to medications - damage to eyes, teeth, lips or other oral mucosa - nerve damage due to positioning  - sore throat or hoarseness - Damage to heart, brain, nerves, lungs, other parts of body or loss of life  Patient voiced understanding.)        Anesthesia Quick Evaluation

## 2022-09-18 NOTE — Anesthesia Postprocedure Evaluation (Signed)
Anesthesia Post Note  Patient: Tara Reynolds  Procedure(s) Performed: CATARACT EXTRACTION PHACO AND INTRAOCULAR LENS PLACEMENT (IOC) RIGHT (Right: Eye)  Patient location during evaluation: PACU Anesthesia Type: MAC Level of consciousness: awake and alert Pain management: pain level controlled Vital Signs Assessment: post-procedure vital signs reviewed and stable Respiratory status: spontaneous breathing, nonlabored ventilation, respiratory function stable and patient connected to nasal cannula oxygen Cardiovascular status: stable and blood pressure returned to baseline Postop Assessment: no apparent nausea or vomiting Anesthetic complications: no   There were no known notable events for this encounter.   Last Vitals:  Vitals:   09/18/22 1002 09/18/22 1008  BP: (!) 140/82 (!) 143/93  Pulse: 83 74  Resp: 12 14  Temp: (!) 36.1 C   SpO2: 93% 94%    Last Pain:  Vitals:   09/18/22 1008  TempSrc:   PainSc: 0-No pain                 Dimas Millin

## 2022-09-18 NOTE — Transfer of Care (Signed)
Immediate Anesthesia Transfer of Care Note  Patient: Tara Reynolds  Procedure(s) Performed: CATARACT EXTRACTION PHACO AND INTRAOCULAR LENS PLACEMENT (IOC) RIGHT (Right: Eye)  Patient Location: PACU  Anesthesia Type: MAC  Level of Consciousness: awake, alert  and patient cooperative  Airway and Oxygen Therapy: Patient Spontanous Breathing and Patient connected to supplemental oxygen  Post-op Assessment: Post-op Vital signs reviewed, Patient's Cardiovascular Status Stable, Respiratory Function Stable, Patent Airway and No signs of Nausea or vomiting  Post-op Vital Signs: Reviewed and stable  Complications: There were no known notable events for this encounter.

## 2022-09-18 NOTE — H&P (Signed)
[facility]   Primary Care Physician:  Dion Body, MD Ophthalmologist: Dr. George Ina  Pre-Procedure History & Physical: HPI:  Tara Reynolds is a 76 y.o. female here for cataract surgery.   Past Medical History:  Diagnosis Date   Arthritis    Hypertension    Rosacea     Past Surgical History:  Procedure Laterality Date   CATARACT EXTRACTION W/PHACO Left 09/04/2022   Procedure: CATARACT EXTRACTION PHACO AND INTRAOCULAR LENS PLACEMENT (IOC) LEFT DIABETIC 6.06 00:44.3;  Surgeon: Birder Robson, MD;  Location: [facility];  Service: Ophthalmology;  Laterality: Left;   FOOT SURGERY     HAND SURGERY      Prior to Admission medications   Medication Sig Start Date End Date Taking? Authorizing Provider  azelastine (OPTIVAR) 0.05 % ophthalmic solution 1 drop 2 (two) times daily.   Yes [provider]  Biotin 5 MG TABS Take by mouth daily.   Yes [provider]  Calcium Carbonate-Vitamin D 600-400 MG-UNIT tablet Take 2 tablets by mouth daily.   Yes [provider]  docusate sodium (COLACE) 100 MG capsule Take 100 mg by mouth 2 (two) times daily as needed for mild constipation.   Yes [provider]  fluticasone (FLONASE) 50 MCG/ACT nasal spray Place into both nostrils daily.   Yes [provider]  loratadine (CLARITIN) 10 MG tablet Take 1 tablet by mouth as needed.   Yes [provider]  losartan (COZAAR) 50 MG tablet Take 50 mg by mouth daily.   Yes [provider]  naproxen sodium (ALEVE) 220 MG tablet Take 220 mg by mouth daily as needed.   Yes [provider]  simvastatin (ZOCOR) 20 MG tablet Take 1 tablet by mouth daily. 09/18/20  Yes [provider]  venlafaxine XR (EFFEXOR-XR) 150 MG 24 hr capsule Take 150 mg by mouth daily with breakfast.   Yes [provider]  Azelaic Acid 15 % cream Apply 1 application topically daily. 12/22/20   [provider]  estradiol  (ESTRACE) 0.1 MG/GM vaginal cream Place 1 g vaginally as needed. Patient not taking: Reported on 08/27/2022 09/29/20   [provider]    Allergies as of 07/17/2022 - Review Complete 01/18/2021  Allergen Reaction Noted   Citrus bioflavonoid Anaphylaxis, Hives, Rash, and Shortness Of Breath 11/01/2020   Melatonin Anaphylaxis and Rash 03/14/2016   Other Anaphylaxis 12/06/2014   Shellfish allergy Anaphylaxis 02/08/2016    History reviewed. No pertinent family history.  Social History   Socioeconomic History   Marital status: Married    Spouse name: Not on file   Number of children: Not on file   Years of education: Not on file   Highest education level: Not on file  Occupational History   Not on file  Tobacco Use   Smoking status: Former    Types: Cigarettes    Quit date: 1980    Years since quitting: 43.8   Smokeless tobacco: Never  Vaping Use   Vaping Use: Never used  Substance and Sexual Activity   Alcohol use: Not Currently   Drug use: Not on file   Sexual activity: Not on file  Other Topics Concern   Not on file  Social History Narrative   Not on file   Social Determinants of Health   Financial Resource Strain: Not on file  Food Insecurity: Not on file  Transportation Needs: Not on file  Physical Activity: Not on file  Stress: Not on file  Social  Connections: Not on file  Intimate Partner Violence: Not on file    Review of Systems: See HPI, otherwise negative ROS  Physical Exam: BP 124/85   Temp 97.6 F (36.4 C) (Tympanic)   Ht 5' 5.98" (1.676 m)   Wt 84.4 kg   SpO2 94%   BMI 30.04 kg/m  General:   Alert, cooperative in NAD Head:  Normocephalic and atraumatic. Respiratory:  Normal work of breathing. Cardiovascular:  RRR  Impression/Plan: Tara Reynolds is here for cataract surgery.  Risks, benefits, limitations, and alternatives regarding cataract surgery have been reviewed with the patient.  Questions have been answered.  All parties  agreeable.   Birder Robson, MD  09/18/2022, 9:37 AM

## 2022-09-18 NOTE — Op Note (Signed)
PREOPERATIVE DIAGNOSIS:  Nuclear sclerotic cataract of the right eye.   POSTOPERATIVE DIAGNOSIS:  Cataract   OPERATIVE PROCEDURE:ORPROCALL@   SURGEON:  Birder Robson, MD.   ANESTHESIA:  Anesthesiologist: Dimas Millin, MD CRNA: Patience Musca., CRNA  1.      Managed anesthesia care. 2.      0.2ml of Shugarcaine was instilled in the eye following the paracentesis.   COMPLICATIONS:  None.   TECHNIQUE:   Stop and chop   DESCRIPTION OF PROCEDURE:  The patient was examined and consented in the preoperative holding area where the aforementioned topical anesthesia was applied to the right eye and then brought back to the Operating Room where the right eye was prepped and draped in the usual sterile ophthalmic fashion and a lid speculum was placed. A paracentesis was created with the side port blade and the anterior chamber was filled with viscoelastic. A near clear corneal incision was performed with the steel keratome. A continuous curvilinear capsulorrhexis was performed with a cystotome followed by the capsulorrhexis forceps. Hydrodissection and hydrodelineation were carried out with BSS on a blunt cannula. The lens was removed in a stop and chop  technique and the remaining cortical material was removed with the irrigation-aspiration handpiece. The capsular bag was inflated with viscoelastic and the Technis [id number]  lens was placed in the capsular bag without complication. The remaining viscoelastic was removed from the eye with the irrigation-aspiration handpiece. The wounds were hydrated. The anterior chamber was flushed with BSS and the eye was inflated to physiologic pressure. 0.64ml of Vigamox was placed in the anterior chamber. The wounds were found to be water tight. The eye was dressed with Combigan. The patient was given protective glasses to wear throughout the day and a shield with which to sleep tonight. The patient was also given drops with which to begin a drop regimen today and  will follow-up with me in one day. Implant Name Type Inv. Item Serial No. Manufacturer Lot No. LRB No. Used Action  LENS IOL TECNIS EYHANCE 21.0 - [id number] Intraocular Lens LENS IOL TECNIS EYHANCE 21.0 [id number] [employer]  Right 1 Implanted   Procedure(s) with comments: CATARACT EXTRACTION PHACO AND INTRAOCULAR LENS PLACEMENT (IOC) RIGHT (Right) - 7.52 0:41.7  Electronically signed: Birder Robson 09/18/2022 10:01 AM

## 2022-09-20 ENCOUNTER — Encounter: Payer: Self-pay | Admitting: Ophthalmology
